# Patient Record
Sex: Female | Born: 1935 | Race: White | Hispanic: No | Marital: Married | Smoking: Never smoker
Health system: Southern US, Community
[De-identification: ages and names within clinical notes are randomized; demographics above are authoritative.]

## PROBLEM LIST (undated history)

## (undated) DIAGNOSIS — I10 Essential (primary) hypertension: Secondary | ICD-10-CM

## (undated) DIAGNOSIS — E7800 Pure hypercholesterolemia, unspecified: Secondary | ICD-10-CM

## (undated) HISTORY — PX: TUBAL LIGATION: SHX77

## (undated) HISTORY — PX: OVARIAN CYST REMOVAL: SHX89

---

## 2002-04-30 ENCOUNTER — Ambulatory Visit (HOSPITAL_COMMUNITY): Admission: RE | Admit: 2002-04-30 | Discharge: 2002-04-30 | Payer: Self-pay | Admitting: Obstetrics & Gynecology

## 2002-04-30 ENCOUNTER — Encounter: Payer: Self-pay | Admitting: Obstetrics & Gynecology

## 2003-11-10 ENCOUNTER — Ambulatory Visit (HOSPITAL_COMMUNITY): Admission: RE | Admit: 2003-11-10 | Discharge: 2003-11-10 | Payer: Self-pay | Admitting: Obstetrics and Gynecology

## 2005-04-27 ENCOUNTER — Ambulatory Visit (HOSPITAL_COMMUNITY): Admission: RE | Admit: 2005-04-27 | Discharge: 2005-04-27 | Payer: Self-pay | Admitting: Obstetrics and Gynecology

## 2011-01-16 ENCOUNTER — Emergency Department (HOSPITAL_COMMUNITY): Payer: Medicare Other

## 2011-01-16 ENCOUNTER — Encounter: Payer: Self-pay | Admitting: *Deleted

## 2011-01-16 ENCOUNTER — Emergency Department (HOSPITAL_COMMUNITY)
Admission: EM | Admit: 2011-01-16 | Discharge: 2011-01-17 | Disposition: A | Discharge status: Home / Self Care | Payer: Medicare Other | Attending: Emergency Medicine | Admitting: Emergency Medicine

## 2011-01-16 DIAGNOSIS — I10 Essential (primary) hypertension: Secondary | ICD-10-CM | POA: Insufficient documentation

## 2011-01-16 DIAGNOSIS — M531 Cervicobrachial syndrome: Secondary | ICD-10-CM | POA: Insufficient documentation

## 2011-01-16 DIAGNOSIS — M5481 Occipital neuralgia: Secondary | ICD-10-CM

## 2011-01-16 DIAGNOSIS — M542 Cervicalgia: Secondary | ICD-10-CM

## 2011-01-16 DIAGNOSIS — E7800 Pure hypercholesterolemia, unspecified: Secondary | ICD-10-CM | POA: Insufficient documentation

## 2011-01-16 HISTORY — DX: Pure hypercholesterolemia, unspecified: E78.00

## 2011-01-16 HISTORY — DX: Essential (primary) hypertension: I10

## 2011-01-16 MED ORDER — HYDROCODONE-ACETAMINOPHEN 5-325 MG PO TABS
2.0000 | ORAL_TABLET | Freq: Once | ORAL | Status: AC
  Administered 2011-01-16: 2 via ORAL
  Filled 2011-01-16: qty 2

## 2011-01-16 MED ORDER — HYDROCODONE-ACETAMINOPHEN 5-500 MG PO TABS: 1.0000 | ORAL_TABLET | Freq: Four times a day (QID) | ORAL | Status: AC | PRN

## 2011-01-16 MED ORDER — PREDNISONE 10 MG PO TABS: 20.0000 mg | ORAL_TABLET | Freq: Every day | ORAL | Status: AC

## 2011-01-16 MED ORDER — KETOROLAC TROMETHAMINE 30 MG/ML IJ SOLN
30.0000 mg | Freq: Once | INTRAMUSCULAR | Status: AC
  Administered 2011-01-16: 30 mg via INTRAMUSCULAR
  Filled 2011-01-16: qty 1

## 2011-01-16 NOTE — ED Notes (Signed)
Pt states that the pain radiates toward her head with certain movements and palpation of neck area

## 2011-01-16 NOTE — ED Provider Notes (Signed)
History     CSN: [id number] Arrival date & time: 01/16/2011  9:30 PM  Chief Complaint  Patient presents with  . Torticollis   HPI Comments: Patient states neck pain since waking up this morning.  She denies injury or trauma.  Pain comes and goes, is what she describes as severe.  No numbness or tingling.    The history is provided by the patient.    Past Medical History  Diagnosis Date  . Hypertension   . Hypercholesteremia     Past Surgical History  Procedure Date  . Tubal ligation   . Ovarian cyst removal     History reviewed. No pertinent family history.  History  Substance Use Topics  . Smoking status: Never Smoker   . Smokeless tobacco: Not on file  . Alcohol Use: No    OB History    Grav Para Term Preterm Abortions TAB SAB Ect Mult Living                  Review of Systems  Constitutional: Negative for fever and chills.  HENT: Positive for neck pain and neck stiffness. Negative for facial swelling.   Eyes: Negative.   Respiratory: Negative for chest tightness and shortness of breath.   Cardiovascular: Negative for chest pain and palpitations.  Neurological: Negative for numbness.  All other systems reviewed and are negative.    Physical Exam  BP 160/88  Pulse 72  Temp(Src) 97.7 F (36.5 C) (Oral)  Resp 16  Ht 5' (1.524 m)  Wt 135 lb (61.236 kg)  BMI 26.37 kg/m2  SpO2 100%  Physical Exam  Constitutional: She is oriented to person, place, and time. She appears well-developed and well-nourished.       uncomfortable  HENT:  Head: Normocephalic and atraumatic.  Eyes: Pupils are equal, round, and reactive to light.  Neck:       There is ttp of the soft tissues of the left side of the neck.  Range of motion is limited due to pain.  Cardiovascular: Normal rate and regular rhythm.   Pulmonary/Chest: Effort normal and breath sounds normal.  Musculoskeletal: Normal range of motion.  Neurological: She is alert and oriented to person, place, and time.  No cranial nerve deficit. She exhibits normal muscle tone. Coordination normal.    ED Course  Procedures  MDM Xrays show degenerative changes in the cervical spine.  Nothing acute.  ? Occipital Neuralgia.  Will treat with steroids, pain meds.  See pcp if not improving in the next week.      Geoffery Lyons, MD 01/16/11 2329

## 2011-01-16 NOTE — ED Notes (Signed)
Pt states that she woke up with pain to neck area, states that pain is increased with movements a certain way, denies any injury

## 2011-01-16 NOTE — ED Notes (Signed)
Patient unable to move head to left side

## 2011-10-09 DIAGNOSIS — IMO0002 Reserved for concepts with insufficient information to code with codable children: Secondary | ICD-10-CM | POA: Diagnosis not present

## 2011-10-09 DIAGNOSIS — I10 Essential (primary) hypertension: Secondary | ICD-10-CM | POA: Diagnosis not present

## 2011-10-09 DIAGNOSIS — E559 Vitamin D deficiency, unspecified: Secondary | ICD-10-CM | POA: Diagnosis not present

## 2011-10-09 DIAGNOSIS — Z79899 Other long term (current) drug therapy: Secondary | ICD-10-CM | POA: Diagnosis not present

## 2011-10-09 DIAGNOSIS — D649 Anemia, unspecified: Secondary | ICD-10-CM | POA: Diagnosis not present

## 2011-10-09 DIAGNOSIS — E785 Hyperlipidemia, unspecified: Secondary | ICD-10-CM | POA: Diagnosis not present

## 2011-10-09 DIAGNOSIS — R5383 Other fatigue: Secondary | ICD-10-CM | POA: Diagnosis not present

## 2011-10-09 DIAGNOSIS — R5381 Other malaise: Secondary | ICD-10-CM | POA: Diagnosis not present

## 2011-10-09 DIAGNOSIS — Z23 Encounter for immunization: Secondary | ICD-10-CM | POA: Diagnosis not present

## 2012-03-07 DIAGNOSIS — Z23 Encounter for immunization: Secondary | ICD-10-CM | POA: Diagnosis not present

## 2013-02-03 DIAGNOSIS — IMO0002 Reserved for concepts with insufficient information to code with codable children: Secondary | ICD-10-CM | POA: Diagnosis not present

## 2013-02-03 DIAGNOSIS — E119 Type 2 diabetes mellitus without complications: Secondary | ICD-10-CM | POA: Diagnosis not present

## 2013-02-20 DIAGNOSIS — R7309 Other abnormal glucose: Secondary | ICD-10-CM | POA: Diagnosis not present

## 2013-02-27 DIAGNOSIS — E785 Hyperlipidemia, unspecified: Secondary | ICD-10-CM | POA: Diagnosis not present

## 2013-02-27 DIAGNOSIS — I10 Essential (primary) hypertension: Secondary | ICD-10-CM | POA: Diagnosis not present

## 2013-02-27 DIAGNOSIS — R7309 Other abnormal glucose: Secondary | ICD-10-CM | POA: Diagnosis not present

## 2013-03-12 DIAGNOSIS — Z23 Encounter for immunization: Secondary | ICD-10-CM | POA: Diagnosis not present

## 2014-03-10 DIAGNOSIS — Z23 Encounter for immunization: Secondary | ICD-10-CM | POA: Diagnosis not present

## 2014-06-10 DIAGNOSIS — E782 Mixed hyperlipidemia: Secondary | ICD-10-CM | POA: Diagnosis not present

## 2014-06-10 DIAGNOSIS — I10 Essential (primary) hypertension: Secondary | ICD-10-CM | POA: Diagnosis not present

## 2014-06-10 DIAGNOSIS — Z6824 Body mass index (BMI) 24.0-24.9, adult: Secondary | ICD-10-CM | POA: Diagnosis not present

## 2015-03-15 DIAGNOSIS — Z23 Encounter for immunization: Secondary | ICD-10-CM | POA: Diagnosis not present

## 2015-03-18 DIAGNOSIS — Z1389 Encounter for screening for other disorder: Secondary | ICD-10-CM | POA: Diagnosis not present

## 2015-03-18 DIAGNOSIS — I10 Essential (primary) hypertension: Secondary | ICD-10-CM | POA: Diagnosis not present

## 2015-03-18 DIAGNOSIS — R42 Dizziness and giddiness: Secondary | ICD-10-CM | POA: Diagnosis not present

## 2015-03-18 DIAGNOSIS — E782 Mixed hyperlipidemia: Secondary | ICD-10-CM | POA: Diagnosis not present

## 2015-03-18 DIAGNOSIS — Z6824 Body mass index (BMI) 24.0-24.9, adult: Secondary | ICD-10-CM | POA: Diagnosis not present

## 2015-04-02 DIAGNOSIS — E782 Mixed hyperlipidemia: Secondary | ICD-10-CM | POA: Diagnosis not present

## 2015-04-02 DIAGNOSIS — Z1389 Encounter for screening for other disorder: Secondary | ICD-10-CM | POA: Diagnosis not present

## 2015-04-02 DIAGNOSIS — I10 Essential (primary) hypertension: Secondary | ICD-10-CM | POA: Diagnosis not present

## 2015-04-02 DIAGNOSIS — R42 Dizziness and giddiness: Secondary | ICD-10-CM | POA: Diagnosis not present

## 2015-04-02 DIAGNOSIS — Z6824 Body mass index (BMI) 24.0-24.9, adult: Secondary | ICD-10-CM | POA: Diagnosis not present

## 2015-04-08 DIAGNOSIS — I10 Essential (primary) hypertension: Secondary | ICD-10-CM | POA: Diagnosis not present

## 2015-04-08 DIAGNOSIS — E782 Mixed hyperlipidemia: Secondary | ICD-10-CM | POA: Diagnosis not present

## 2015-06-14 DIAGNOSIS — J209 Acute bronchitis, unspecified: Secondary | ICD-10-CM | POA: Diagnosis not present

## 2015-06-14 DIAGNOSIS — Z6823 Body mass index (BMI) 23.0-23.9, adult: Secondary | ICD-10-CM | POA: Diagnosis not present

## 2015-06-14 DIAGNOSIS — J069 Acute upper respiratory infection, unspecified: Secondary | ICD-10-CM | POA: Diagnosis not present

## 2015-06-14 DIAGNOSIS — Z1389 Encounter for screening for other disorder: Secondary | ICD-10-CM | POA: Diagnosis not present

## 2015-09-20 DIAGNOSIS — Z1389 Encounter for screening for other disorder: Secondary | ICD-10-CM | POA: Diagnosis not present

## 2015-09-20 DIAGNOSIS — Z6822 Body mass index (BMI) 22.0-22.9, adult: Secondary | ICD-10-CM | POA: Diagnosis not present

## 2015-09-20 DIAGNOSIS — Z0000 Encounter for general adult medical examination without abnormal findings: Secondary | ICD-10-CM | POA: Diagnosis not present

## 2016-03-13 DIAGNOSIS — Z1389 Encounter for screening for other disorder: Secondary | ICD-10-CM | POA: Diagnosis not present

## 2016-03-13 DIAGNOSIS — R7309 Other abnormal glucose: Secondary | ICD-10-CM | POA: Diagnosis not present

## 2016-03-13 DIAGNOSIS — Z6821 Body mass index (BMI) 21.0-21.9, adult: Secondary | ICD-10-CM | POA: Diagnosis not present

## 2016-03-13 DIAGNOSIS — I10 Essential (primary) hypertension: Secondary | ICD-10-CM | POA: Diagnosis not present

## 2016-03-13 DIAGNOSIS — E782 Mixed hyperlipidemia: Secondary | ICD-10-CM | POA: Diagnosis not present

## 2016-03-13 DIAGNOSIS — Z23 Encounter for immunization: Secondary | ICD-10-CM | POA: Diagnosis not present

## 2016-05-01 DIAGNOSIS — Z1389 Encounter for screening for other disorder: Secondary | ICD-10-CM | POA: Diagnosis not present

## 2016-05-01 DIAGNOSIS — Z6821 Body mass index (BMI) 21.0-21.9, adult: Secondary | ICD-10-CM | POA: Diagnosis not present

## 2016-05-01 DIAGNOSIS — R946 Abnormal results of thyroid function studies: Secondary | ICD-10-CM | POA: Diagnosis not present

## 2016-08-24 DIAGNOSIS — Z6822 Body mass index (BMI) 22.0-22.9, adult: Secondary | ICD-10-CM | POA: Diagnosis not present

## 2016-08-24 DIAGNOSIS — Z1389 Encounter for screening for other disorder: Secondary | ICD-10-CM | POA: Diagnosis not present

## 2016-08-24 DIAGNOSIS — R55 Syncope and collapse: Secondary | ICD-10-CM | POA: Diagnosis not present

## 2016-12-07 DIAGNOSIS — Z0000 Encounter for general adult medical examination without abnormal findings: Secondary | ICD-10-CM | POA: Diagnosis not present

## 2016-12-07 DIAGNOSIS — Z6823 Body mass index (BMI) 23.0-23.9, adult: Secondary | ICD-10-CM | POA: Diagnosis not present

## 2017-02-19 DIAGNOSIS — Z23 Encounter for immunization: Secondary | ICD-10-CM | POA: Diagnosis not present

## 2017-12-10 DIAGNOSIS — Z0000 Encounter for general adult medical examination without abnormal findings: Secondary | ICD-10-CM | POA: Diagnosis not present

## 2017-12-10 DIAGNOSIS — I10 Essential (primary) hypertension: Secondary | ICD-10-CM | POA: Diagnosis not present

## 2017-12-10 DIAGNOSIS — E782 Mixed hyperlipidemia: Secondary | ICD-10-CM | POA: Diagnosis not present

## 2017-12-10 DIAGNOSIS — Z0001 Encounter for general adult medical examination with abnormal findings: Secondary | ICD-10-CM | POA: Diagnosis not present

## 2017-12-10 DIAGNOSIS — Z1389 Encounter for screening for other disorder: Secondary | ICD-10-CM | POA: Diagnosis not present

## 2017-12-10 DIAGNOSIS — Z6822 Body mass index (BMI) 22.0-22.9, adult: Secondary | ICD-10-CM | POA: Diagnosis not present

## 2018-02-26 DIAGNOSIS — Z23 Encounter for immunization: Secondary | ICD-10-CM | POA: Diagnosis not present

## 2018-12-26 DIAGNOSIS — Z0000 Encounter for general adult medical examination without abnormal findings: Secondary | ICD-10-CM | POA: Diagnosis not present

## 2018-12-26 DIAGNOSIS — R946 Abnormal results of thyroid function studies: Secondary | ICD-10-CM | POA: Diagnosis not present

## 2018-12-26 DIAGNOSIS — I10 Essential (primary) hypertension: Secondary | ICD-10-CM | POA: Diagnosis not present

## 2018-12-26 DIAGNOSIS — E782 Mixed hyperlipidemia: Secondary | ICD-10-CM | POA: Diagnosis not present

## 2018-12-26 DIAGNOSIS — Z6821 Body mass index (BMI) 21.0-21.9, adult: Secondary | ICD-10-CM | POA: Diagnosis not present

## 2019-04-02 DIAGNOSIS — Z23 Encounter for immunization: Secondary | ICD-10-CM | POA: Diagnosis not present

## 2019-04-08 DIAGNOSIS — H402210 Chronic angle-closure glaucoma, right eye, stage unspecified: Secondary | ICD-10-CM | POA: Diagnosis not present

## 2019-04-08 DIAGNOSIS — H40033 Anatomical narrow angle, bilateral: Secondary | ICD-10-CM | POA: Diagnosis not present

## 2019-04-08 DIAGNOSIS — H2513 Age-related nuclear cataract, bilateral: Secondary | ICD-10-CM | POA: Diagnosis not present

## 2019-08-04 DIAGNOSIS — Z23 Encounter for immunization: Secondary | ICD-10-CM | POA: Diagnosis not present

## 2019-08-13 DIAGNOSIS — H40033 Anatomical narrow angle, bilateral: Secondary | ICD-10-CM | POA: Diagnosis not present

## 2019-08-13 DIAGNOSIS — H2513 Age-related nuclear cataract, bilateral: Secondary | ICD-10-CM | POA: Diagnosis not present

## 2019-08-13 DIAGNOSIS — H402233 Chronic angle-closure glaucoma, bilateral, severe stage: Secondary | ICD-10-CM | POA: Diagnosis not present

## 2019-08-13 DIAGNOSIS — Z9889 Other specified postprocedural states: Secondary | ICD-10-CM | POA: Diagnosis not present

## 2019-09-01 DIAGNOSIS — Z23 Encounter for immunization: Secondary | ICD-10-CM | POA: Diagnosis not present

## 2019-12-29 DIAGNOSIS — Z1389 Encounter for screening for other disorder: Secondary | ICD-10-CM | POA: Diagnosis not present

## 2019-12-29 DIAGNOSIS — Z6821 Body mass index (BMI) 21.0-21.9, adult: Secondary | ICD-10-CM | POA: Diagnosis not present

## 2019-12-29 DIAGNOSIS — E782 Mixed hyperlipidemia: Secondary | ICD-10-CM | POA: Diagnosis not present

## 2019-12-29 DIAGNOSIS — Z0000 Encounter for general adult medical examination without abnormal findings: Secondary | ICD-10-CM | POA: Diagnosis not present

## 2019-12-29 DIAGNOSIS — R7309 Other abnormal glucose: Secondary | ICD-10-CM | POA: Diagnosis not present

## 2019-12-29 DIAGNOSIS — Z0001 Encounter for general adult medical examination with abnormal findings: Secondary | ICD-10-CM | POA: Diagnosis not present

## 2019-12-29 DIAGNOSIS — N342 Other urethritis: Secondary | ICD-10-CM | POA: Diagnosis not present

## 2019-12-29 DIAGNOSIS — I10 Essential (primary) hypertension: Secondary | ICD-10-CM | POA: Diagnosis not present

## 2020-03-01 DIAGNOSIS — Z23 Encounter for immunization: Secondary | ICD-10-CM | POA: Diagnosis not present

## 2020-04-16 DIAGNOSIS — Z23 Encounter for immunization: Secondary | ICD-10-CM | POA: Diagnosis not present

## 2021-01-18 DIAGNOSIS — E782 Mixed hyperlipidemia: Secondary | ICD-10-CM | POA: Diagnosis not present

## 2021-01-18 DIAGNOSIS — Z0000 Encounter for general adult medical examination without abnormal findings: Secondary | ICD-10-CM | POA: Diagnosis not present

## 2021-01-18 DIAGNOSIS — Z1389 Encounter for screening for other disorder: Secondary | ICD-10-CM | POA: Diagnosis not present

## 2021-01-18 DIAGNOSIS — I10 Essential (primary) hypertension: Secondary | ICD-10-CM | POA: Diagnosis not present

## 2021-01-18 DIAGNOSIS — Z6821 Body mass index (BMI) 21.0-21.9, adult: Secondary | ICD-10-CM | POA: Diagnosis not present

## 2021-01-18 DIAGNOSIS — Z1331 Encounter for screening for depression: Secondary | ICD-10-CM | POA: Diagnosis not present

## 2021-03-25 DIAGNOSIS — Z23 Encounter for immunization: Secondary | ICD-10-CM | POA: Diagnosis not present

## 2021-06-16 ENCOUNTER — Emergency Department (HOSPITAL_COMMUNITY)
Admission: EM | Admit: 2021-06-16 | Discharge: 2021-06-16 | Disposition: A | Discharge status: Home / Self Care | Payer: Medicare Other | Attending: Emergency Medicine | Admitting: Emergency Medicine

## 2021-06-16 ENCOUNTER — Emergency Department (HOSPITAL_COMMUNITY): Payer: Medicare Other

## 2021-06-16 ENCOUNTER — Encounter (HOSPITAL_COMMUNITY): Payer: Self-pay | Admitting: Emergency Medicine

## 2021-06-16 ENCOUNTER — Other Ambulatory Visit: Payer: Self-pay

## 2021-06-16 DIAGNOSIS — W19XXXA Unspecified fall, initial encounter: Secondary | ICD-10-CM | POA: Insufficient documentation

## 2021-06-16 DIAGNOSIS — S3992XA Unspecified injury of lower back, initial encounter: Secondary | ICD-10-CM | POA: Diagnosis present

## 2021-06-16 DIAGNOSIS — Z7982 Long term (current) use of aspirin: Secondary | ICD-10-CM | POA: Insufficient documentation

## 2021-06-16 DIAGNOSIS — I10 Essential (primary) hypertension: Secondary | ICD-10-CM | POA: Insufficient documentation

## 2021-06-16 DIAGNOSIS — S32059A Unspecified fracture of fifth lumbar vertebra, initial encounter for closed fracture: Secondary | ICD-10-CM | POA: Diagnosis not present

## 2021-06-16 MED ORDER — HYDROCODONE-ACETAMINOPHEN 5-325 MG PO TABS: 2.0000 | ORAL_TABLET | ORAL | 0 refills | Status: AC | PRN

## 2021-06-16 NOTE — ED Provider Notes (Addendum)
[facility] EMERGENCY DEPARTMENT Provider Note   CSN: [id number] Arrival date & time: 06/16/21  1545     History  Chief Complaint  Patient presents with   Katrina Nichols is a 88 y.o. female.  Past medical history of hypertension, hyperlipidemia.  Patient presents after a fall from December 15.  She was never evaluated following this.  Ever since then she has had pretty significant lower back pain that extends into her bilateral buttocks and down the rear side of her left leg.  She has had difficulty walking and now has to walk with a cane.  She denies any numbness or tingling to her lower extremities.  She denies saddle anesthesia, bowel or bladder dysfunction.   Fall      Home Medications Prior to Admission medications   Medication Sig Start Date End Date Taking? Authorizing Provider  HYDROcodone-acetaminophen (NORCO/VICODIN) 5-325 MG tablet Take 2 tablets by mouth every 4 (four) hours as needed for up to 5 days. 06/16/21 06/21/21 Yes Nevayah Faust, Finis Bud, PA-C  aspirin EC 81 MG tablet Take 81 mg by mouth daily.      [provider]  lovastatin (MEVACOR) 20 MG tablet Take 20 mg by mouth at bedtime.      [provider]  naproxen sodium (ANAPROX) 220 MG tablet Take 220 mg by mouth 3 (three) times daily. ONCE as needed for neck pain. Last dose was around 7:30pm     [provider]  Omega-3 Fatty Acids (FISH OIL) 600 MG CAPS Take 1 capsule by mouth at bedtime.      [provider]  triamterene-hydrochlorothiazide (MAXZIDE) 75-50 MG per tablet Take 1 tablet by mouth daily.      [provider]      Allergies    Patient has no known allergies.    Review of Systems   Review of Systems  Musculoskeletal:  Positive for back pain and gait problem.  All other systems reviewed and are negative.  Physical Exam Updated Vital Signs BP 140/78    Pulse 62    Temp 98.4 F (36.9 C) (Oral)    Resp 18    Ht 5' (1.524 m)    Wt 50.8 kg    SpO2 98%     BMI 21.87 kg/m  Physical Exam Vitals and nursing note reviewed.  Constitutional:      General: She is not in acute distress.    Appearance: Normal appearance. She is well-developed. She is not ill-appearing, toxic-appearing or diaphoretic.  HENT:     Head: Normocephalic and atraumatic.     Nose: No nasal deformity.     Mouth/Throat:     Lips: Pink. No lesions.  Eyes:     General: Gaze aligned appropriately. No scleral icterus.       Right eye: No discharge.        Left eye: No discharge.     Conjunctiva/sclera: Conjunctivae normal.     Right eye: Right conjunctiva is not injected. No exudate or hemorrhage.    Left eye: Left conjunctiva is not injected. No exudate or hemorrhage. Pulmonary:     Effort: Pulmonary effort is normal. No respiratory distress.  Musculoskeletal:     Comments: There was midline L-spine tenderness with no step-offs.  There is sacral TTP with no step-offs.  Reproducible left-sided paraspinal muscular tenderness. DP/PT pulses 2+ and equal bilaterally No leg edema Sensation grossly intact on anterior thighs, dorsum of foot and lateral foot Strength of  knee flexion and extension is 5/5 Plantar and dorsiflexion of ankle 5/5 Gait normal   Skin:    General: Skin is warm and dry.  Neurological:     Mental Status: She is alert and oriented to person, place, and time.  Psychiatric:        Mood and Affect: Mood normal.        Speech: Speech normal.        Behavior: Behavior normal. Behavior is cooperative.    ED Results / Procedures / Treatments   Labs (all labs ordered are listed, but only abnormal results are displayed) Labs Reviewed - No data to display  EKG None  Radiology DG Sacrum/Coccyx  Result Date: 06/16/2021 CLINICAL DATA:  Larey Seat, sacral pain EXAM: SACRUM AND COCCYX - 2+ VIEW COMPARISON:  None. FINDINGS: Frontal and lateral views of the sacrum and coccyx are obtained. The bones are diffusely osteopenic. Age-indeterminate L5 compression  deformity, with approximately 50% loss of height. No evidence of sacral or coccygeal fracture. Sacroiliac joints are unremarkable. IMPRESSION: 1. Age-indeterminate L5 compression deformity. 2. Unremarkable sacrum and coccyx. 3. Osteopenia. Electronically Signed   By: Sharlet Salina M.D.   On: 06/16/2021 21:42    Procedures Procedures   Medications Ordered in ED Medications - No data to display  ED Course/ Medical Decision Making/ A&P                           Medical Decision Making Problems Addressed: Closed fracture of fifth lumbar vertebra, unspecified fracture morphology, initial encounter [facility]): acute illness or injury  Amount and/or Complexity of Data Reviewed Radiology: ordered and independent interpretation performed. Decision-making details documented in ED Course. Discussion of management or test interpretation with external provider(s): Neurosurgery   Risk Parenteral controlled substances. Decision regarding hospitalization.   This is a 88 y.o. female with a PMH of hypertension, hyperlipidemia who presents to the ED with lower back pain after a fall three weeks ago. No red flag symptoms present. Patient with normal gait with use of cane.   Will obtain CT imaging of lumbar spine to evaluate for fracture.   CT is notable for L5 fracture with superimposed disc bulge and facet hypertrophy at L4-L5 with resulting severe spinal stenosis.   There was no neurosurgery provider on call for [facility] tonight. This is a three week old injury and with no neurological symptoms. She should be able to follow up in the neurosurgery office. Return precautions provided.   I have seen and evaluated this patient in conjunction with my attending physician who agrees and has made changes to the plan accordingly.  Portions of this note were generated with Scientist, clinical (histocompatibility and immunogenetics). Dictation errors may occur despite best attempts at proofreading.  Final Clinical Impression(s) / ED  Diagnoses Final diagnoses:  Fall, initial encounter  Closed fracture of fifth lumbar vertebra, unspecified fracture morphology, initial encounter [facility])    Rx / DC Orders ED Discharge Orders          Ordered    HYDROcodone-acetaminophen (NORCO/VICODIN) 5-325 MG tablet  Every 4 hours PRN        06/16/21 2333              Claudie Leach, PA-C 06/16/21 2337    Claudie Leach, PA-C 06/16/21 2346    Bethann Berkshire, MD 06/17/21 1052

## 2021-06-16 NOTE — ED Triage Notes (Signed)
Pt c/o she tripped and fell 12/15. C/o pain to lower back, bilateral buttocks and legs. Has been using a cane since fall. Nad noted.

## 2021-06-16 NOTE — Discharge Instructions (Addendum)
You have been diagnosed with a fracture of your L5 vertebrae. You will need to follow up with Neurosurgery. The contact information is included in your discharge paperwork.  I have provided you a prescription for pain. Please do not take this prior to driving or operating heavy machinery.

## 2021-07-14 ENCOUNTER — Encounter (HOSPITAL_COMMUNITY): Payer: Self-pay | Admitting: Emergency Medicine

## 2021-07-14 ENCOUNTER — Other Ambulatory Visit: Payer: Self-pay

## 2021-07-14 ENCOUNTER — Emergency Department (HOSPITAL_COMMUNITY): Payer: Medicare Other

## 2021-07-14 ENCOUNTER — Emergency Department (HOSPITAL_COMMUNITY)
Admission: EM | Admit: 2021-07-14 | Discharge: 2021-07-14 | Disposition: A | Discharge status: Home / Self Care | Payer: Medicare Other | Source: Home / Self Care | Attending: Emergency Medicine | Admitting: Emergency Medicine

## 2021-07-14 DIAGNOSIS — R918 Other nonspecific abnormal finding of lung field: Secondary | ICD-10-CM | POA: Diagnosis not present

## 2021-07-14 DIAGNOSIS — S32059D Unspecified fracture of fifth lumbar vertebra, subsequent encounter for fracture with routine healing: Secondary | ICD-10-CM | POA: Diagnosis not present

## 2021-07-14 DIAGNOSIS — S0083XA Contusion of other part of head, initial encounter: Secondary | ICD-10-CM | POA: Diagnosis not present

## 2021-07-14 DIAGNOSIS — R270 Ataxia, unspecified: Secondary | ICD-10-CM | POA: Diagnosis not present

## 2021-07-14 DIAGNOSIS — K829 Disease of gallbladder, unspecified: Secondary | ICD-10-CM | POA: Diagnosis not present

## 2021-07-14 DIAGNOSIS — W19XXXA Unspecified fall, initial encounter: Secondary | ICD-10-CM | POA: Diagnosis not present

## 2021-07-14 DIAGNOSIS — M545 Low back pain, unspecified: Secondary | ICD-10-CM

## 2021-07-14 DIAGNOSIS — S32402A Unspecified fracture of left acetabulum, initial encounter for closed fracture: Secondary | ICD-10-CM | POA: Diagnosis not present

## 2021-07-14 DIAGNOSIS — S0990XA Unspecified injury of head, initial encounter: Secondary | ICD-10-CM | POA: Diagnosis not present

## 2021-07-14 DIAGNOSIS — Z79899 Other long term (current) drug therapy: Secondary | ICD-10-CM | POA: Diagnosis not present

## 2021-07-14 DIAGNOSIS — Z20822 Contact with and (suspected) exposure to covid-19: Secondary | ICD-10-CM | POA: Diagnosis present

## 2021-07-14 DIAGNOSIS — N179 Acute kidney failure, unspecified: Secondary | ICD-10-CM | POA: Diagnosis not present

## 2021-07-14 DIAGNOSIS — I959 Hypotension, unspecified: Secondary | ICD-10-CM | POA: Diagnosis not present

## 2021-07-14 DIAGNOSIS — R079 Chest pain, unspecified: Secondary | ICD-10-CM | POA: Diagnosis not present

## 2021-07-14 DIAGNOSIS — M25552 Pain in left hip: Secondary | ICD-10-CM

## 2021-07-14 DIAGNOSIS — S32810A Multiple fractures of pelvis with stable disruption of pelvic ring, initial encounter for closed fracture: Secondary | ICD-10-CM | POA: Diagnosis present

## 2021-07-14 DIAGNOSIS — Z7982 Long term (current) use of aspirin: Secondary | ICD-10-CM | POA: Insufficient documentation

## 2021-07-14 DIAGNOSIS — S0181XA Laceration without foreign body of other part of head, initial encounter: Secondary | ICD-10-CM | POA: Insufficient documentation

## 2021-07-14 DIAGNOSIS — Z7401 Bed confinement status: Secondary | ICD-10-CM | POA: Diagnosis not present

## 2021-07-14 DIAGNOSIS — S32059A Unspecified fracture of fifth lumbar vertebra, initial encounter for closed fracture: Secondary | ICD-10-CM | POA: Diagnosis not present

## 2021-07-14 DIAGNOSIS — I739 Peripheral vascular disease, unspecified: Secondary | ICD-10-CM | POA: Diagnosis not present

## 2021-07-14 DIAGNOSIS — J9601 Acute respiratory failure with hypoxia: Secondary | ICD-10-CM | POA: Diagnosis not present

## 2021-07-14 DIAGNOSIS — R7989 Other specified abnormal findings of blood chemistry: Secondary | ICD-10-CM | POA: Diagnosis present

## 2021-07-14 DIAGNOSIS — R0902 Hypoxemia: Secondary | ICD-10-CM | POA: Diagnosis not present

## 2021-07-14 DIAGNOSIS — S060X0A Concussion without loss of consciousness, initial encounter: Secondary | ICD-10-CM | POA: Diagnosis present

## 2021-07-14 DIAGNOSIS — S299XXA Unspecified injury of thorax, initial encounter: Secondary | ICD-10-CM | POA: Diagnosis not present

## 2021-07-14 DIAGNOSIS — S32512A Fracture of superior rim of left pubis, initial encounter for closed fracture: Secondary | ICD-10-CM | POA: Diagnosis not present

## 2021-07-14 DIAGNOSIS — S32050D Wedge compression fracture of fifth lumbar vertebra, subsequent encounter for fracture with routine healing: Secondary | ICD-10-CM

## 2021-07-14 DIAGNOSIS — Y92410 Unspecified street and highway as the place of occurrence of the external cause: Secondary | ICD-10-CM | POA: Diagnosis not present

## 2021-07-14 DIAGNOSIS — S0993XA Unspecified injury of face, initial encounter: Secondary | ICD-10-CM | POA: Diagnosis not present

## 2021-07-14 DIAGNOSIS — M4312 Spondylolisthesis, cervical region: Secondary | ICD-10-CM | POA: Diagnosis not present

## 2021-07-14 DIAGNOSIS — E860 Dehydration: Secondary | ICD-10-CM | POA: Diagnosis present

## 2021-07-14 DIAGNOSIS — E7800 Pure hypercholesterolemia, unspecified: Secondary | ICD-10-CM | POA: Diagnosis present

## 2021-07-14 DIAGNOSIS — M4856XD Collapsed vertebra, not elsewhere classified, lumbar region, subsequent encounter for fracture with routine healing: Secondary | ICD-10-CM | POA: Diagnosis present

## 2021-07-14 DIAGNOSIS — R52 Pain, unspecified: Secondary | ICD-10-CM | POA: Diagnosis not present

## 2021-07-14 DIAGNOSIS — Z041 Encounter for examination and observation following transport accident: Secondary | ICD-10-CM | POA: Diagnosis not present

## 2021-07-14 DIAGNOSIS — R569 Unspecified convulsions: Secondary | ICD-10-CM | POA: Diagnosis not present

## 2021-07-14 DIAGNOSIS — S3210XA Unspecified fracture of sacrum, initial encounter for closed fracture: Secondary | ICD-10-CM | POA: Diagnosis not present

## 2021-07-14 DIAGNOSIS — I10 Essential (primary) hypertension: Secondary | ICD-10-CM | POA: Diagnosis not present

## 2021-07-14 DIAGNOSIS — W19XXXD Unspecified fall, subsequent encounter: Secondary | ICD-10-CM | POA: Diagnosis present

## 2021-07-14 DIAGNOSIS — S32502A Unspecified fracture of left pubis, initial encounter for closed fracture: Secondary | ICD-10-CM | POA: Diagnosis not present

## 2021-07-14 DIAGNOSIS — R911 Solitary pulmonary nodule: Secondary | ICD-10-CM | POA: Diagnosis not present

## 2021-07-14 DIAGNOSIS — R102 Pelvic and perineal pain: Secondary | ICD-10-CM | POA: Diagnosis not present

## 2021-07-14 DIAGNOSIS — S32110A Nondisplaced Zone I fracture of sacrum, initial encounter for closed fracture: Secondary | ICD-10-CM | POA: Diagnosis present

## 2021-07-14 DIAGNOSIS — R54 Age-related physical debility: Secondary | ICD-10-CM | POA: Diagnosis present

## 2021-07-14 LAB — CBC WITH DIFFERENTIAL/PLATELET
Abs Immature Granulocytes: 0.11 10*3/uL — ABNORMAL HIGH (ref 0.00–0.07)
Basophils Absolute: 0.1 10*3/uL (ref 0.0–0.1)
Basophils Relative: 1 %
Eosinophils Absolute: 0.1 10*3/uL (ref 0.0–0.5)
Eosinophils Relative: 1 %
HCT: 43.5 % (ref 36.0–46.0)
Hemoglobin: 14.2 g/dL (ref 12.0–15.0)
Immature Granulocytes: 1 %
Lymphocytes Relative: 13 %
Lymphs Abs: 1.2 10*3/uL (ref 0.7–4.0)
MCH: 31.8 pg (ref 26.0–34.0)
MCHC: 32.6 g/dL (ref 30.0–36.0)
MCV: 97.3 fL (ref 80.0–100.0)
Monocytes Absolute: 0.7 10*3/uL (ref 0.1–1.0)
Monocytes Relative: 7 %
Neutro Abs: 7.1 10*3/uL (ref 1.7–7.7)
Neutrophils Relative %: 77 %
Platelets: 275 10*3/uL (ref 150–400)
RBC: 4.47 MIL/uL (ref 3.87–5.11)
RDW: 13.9 % (ref 11.5–15.5)
WBC: 9.3 10*3/uL (ref 4.0–10.5)
nRBC: 0 % (ref 0.0–0.2)

## 2021-07-14 LAB — BASIC METABOLIC PANEL
Anion gap: 7 (ref 5–15)
BUN: 26 mg/dL — ABNORMAL HIGH (ref 8–23)
CO2: 27 mmol/L (ref 22–32)
Calcium: 10 mg/dL (ref 8.9–10.3)
Chloride: 102 mmol/L (ref 98–111)
Creatinine, Ser: 0.92 mg/dL (ref 0.44–1.00)
GFR, Estimated: >60 mL/min (ref >60)
Glucose, Bld: 108 mg/dL — ABNORMAL HIGH (ref 70–99)
Potassium: 4.3 mmol/L (ref 3.5–5.1)
Sodium: 136 mmol/L (ref 135–145)

## 2021-07-14 MED ORDER — IOHEXOL 300 MG/ML  SOLN
100.0000 mL | Freq: Once | INTRAMUSCULAR | Status: AC | PRN
  Administered 2021-07-14: 100 mL via INTRAVENOUS
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
## 2021-07-14 NOTE — ED Notes (Signed)
Patient transported to CT 

## 2021-07-14 NOTE — Discharge Instructions (Signed)
Keep your appointment to follow-up with neurosurgery.  Expect to be very sore and stiff over the next few days.  Take Tylenol as needed.  No new injuries of significance the L5 lumbar compression fracture is unchanged.

## 2021-07-15 ENCOUNTER — Emergency Department (HOSPITAL_COMMUNITY): Payer: Medicare Other

## 2021-07-15 ENCOUNTER — Inpatient Hospital Stay (HOSPITAL_COMMUNITY)
Admission: EM | Admit: 2021-07-15 | Discharge: 2021-07-19 | DRG: 100 | Disposition: A | Discharge status: Home Health | Payer: Medicare Other | Attending: Internal Medicine | Admitting: Internal Medicine

## 2021-07-15 ENCOUNTER — Other Ambulatory Visit: Payer: Self-pay

## 2021-07-15 ENCOUNTER — Encounter (HOSPITAL_COMMUNITY): Payer: Self-pay

## 2021-07-15 ENCOUNTER — Inpatient Hospital Stay (HOSPITAL_COMMUNITY): Payer: Medicare Other

## 2021-07-15 DIAGNOSIS — M4312 Spondylolisthesis, cervical region: Secondary | ICD-10-CM | POA: Diagnosis not present

## 2021-07-15 DIAGNOSIS — M25552 Pain in left hip: Secondary | ICD-10-CM

## 2021-07-15 DIAGNOSIS — I739 Peripheral vascular disease, unspecified: Secondary | ICD-10-CM | POA: Diagnosis not present

## 2021-07-15 DIAGNOSIS — N179 Acute kidney failure, unspecified: Secondary | ICD-10-CM | POA: Diagnosis present

## 2021-07-15 DIAGNOSIS — S32810A Multiple fractures of pelvis with stable disruption of pelvic ring, initial encounter for closed fracture: Secondary | ICD-10-CM | POA: Diagnosis present

## 2021-07-15 DIAGNOSIS — R270 Ataxia, unspecified: Secondary | ICD-10-CM | POA: Diagnosis not present

## 2021-07-15 DIAGNOSIS — R0603 Acute respiratory distress: Secondary | ICD-10-CM

## 2021-07-15 DIAGNOSIS — R7989 Other specified abnormal findings of blood chemistry: Secondary | ICD-10-CM | POA: Diagnosis present

## 2021-07-15 DIAGNOSIS — J9601 Acute respiratory failure with hypoxia: Secondary | ICD-10-CM | POA: Diagnosis present

## 2021-07-15 DIAGNOSIS — R569 Unspecified convulsions: Secondary | ICD-10-CM | POA: Diagnosis present

## 2021-07-15 DIAGNOSIS — R0902 Hypoxemia: Secondary | ICD-10-CM | POA: Diagnosis not present

## 2021-07-15 DIAGNOSIS — E860 Dehydration: Secondary | ICD-10-CM | POA: Diagnosis present

## 2021-07-15 DIAGNOSIS — W19XXXD Unspecified fall, subsequent encounter: Secondary | ICD-10-CM | POA: Diagnosis present

## 2021-07-15 DIAGNOSIS — I959 Hypotension, unspecified: Secondary | ICD-10-CM | POA: Diagnosis not present

## 2021-07-15 DIAGNOSIS — Y92410 Unspecified street and highway as the place of occurrence of the external cause: Secondary | ICD-10-CM

## 2021-07-15 DIAGNOSIS — Z20822 Contact with and (suspected) exposure to covid-19: Secondary | ICD-10-CM | POA: Diagnosis present

## 2021-07-15 DIAGNOSIS — S0083XA Contusion of other part of head, initial encounter: Secondary | ICD-10-CM | POA: Diagnosis present

## 2021-07-15 DIAGNOSIS — Z79899 Other long term (current) drug therapy: Secondary | ICD-10-CM

## 2021-07-15 DIAGNOSIS — S32110A Nondisplaced Zone I fracture of sacrum, initial encounter for closed fracture: Secondary | ICD-10-CM | POA: Diagnosis present

## 2021-07-15 DIAGNOSIS — S060X0A Concussion without loss of consciousness, initial encounter: Secondary | ICD-10-CM | POA: Diagnosis present

## 2021-07-15 DIAGNOSIS — Z041 Encounter for examination and observation following transport accident: Secondary | ICD-10-CM | POA: Diagnosis not present

## 2021-07-15 DIAGNOSIS — E7800 Pure hypercholesterolemia, unspecified: Secondary | ICD-10-CM | POA: Diagnosis present

## 2021-07-15 DIAGNOSIS — S0990XA Unspecified injury of head, initial encounter: Secondary | ICD-10-CM | POA: Diagnosis not present

## 2021-07-15 DIAGNOSIS — R911 Solitary pulmonary nodule: Secondary | ICD-10-CM | POA: Diagnosis present

## 2021-07-15 DIAGNOSIS — Z7401 Bed confinement status: Secondary | ICD-10-CM | POA: Diagnosis not present

## 2021-07-15 DIAGNOSIS — S3210XA Unspecified fracture of sacrum, initial encounter for closed fracture: Secondary | ICD-10-CM | POA: Diagnosis not present

## 2021-07-15 DIAGNOSIS — S329XXA Fracture of unspecified parts of lumbosacral spine and pelvis, initial encounter for closed fracture: Secondary | ICD-10-CM | POA: Diagnosis present

## 2021-07-15 DIAGNOSIS — S32512A Fracture of superior rim of left pubis, initial encounter for closed fracture: Secondary | ICD-10-CM | POA: Diagnosis not present

## 2021-07-15 DIAGNOSIS — M4856XD Collapsed vertebra, not elsewhere classified, lumbar region, subsequent encounter for fracture with routine healing: Secondary | ICD-10-CM | POA: Diagnosis present

## 2021-07-15 DIAGNOSIS — I10 Essential (primary) hypertension: Secondary | ICD-10-CM | POA: Diagnosis present

## 2021-07-15 DIAGNOSIS — R54 Age-related physical debility: Secondary | ICD-10-CM | POA: Diagnosis present

## 2021-07-15 DIAGNOSIS — S32059A Unspecified fracture of fifth lumbar vertebra, initial encounter for closed fracture: Secondary | ICD-10-CM | POA: Diagnosis not present

## 2021-07-15 DIAGNOSIS — S32402A Unspecified fracture of left acetabulum, initial encounter for closed fracture: Secondary | ICD-10-CM | POA: Diagnosis not present

## 2021-07-15 DIAGNOSIS — R918 Other nonspecific abnormal finding of lung field: Secondary | ICD-10-CM

## 2021-07-15 DIAGNOSIS — S32502A Unspecified fracture of left pubis, initial encounter for closed fracture: Secondary | ICD-10-CM | POA: Diagnosis not present

## 2021-07-15 DIAGNOSIS — S72009A Fracture of unspecified part of neck of unspecified femur, initial encounter for closed fracture: Secondary | ICD-10-CM | POA: Diagnosis present

## 2021-07-15 LAB — CBC WITH DIFFERENTIAL/PLATELET
Abs Immature Granulocytes: 0.04 10*3/uL (ref 0.00–0.07)
Basophils Absolute: 0.1 10*3/uL (ref 0.0–0.1)
Basophils Relative: 1 %
Eosinophils Absolute: 0.3 10*3/uL (ref 0.0–0.5)
Eosinophils Relative: 3 %
HCT: 40.3 % (ref 36.0–46.0)
Hemoglobin: 13.2 g/dL (ref 12.0–15.0)
Immature Granulocytes: 0 %
Lymphocytes Relative: 14 %
Lymphs Abs: 1.5 10*3/uL (ref 0.7–4.0)
MCH: 31.3 pg (ref 26.0–34.0)
MCHC: 32.8 g/dL (ref 30.0–36.0)
MCV: 95.5 fL (ref 80.0–100.0)
Monocytes Absolute: 1.1 10*3/uL — ABNORMAL HIGH (ref 0.1–1.0)
Monocytes Relative: 10 %
Neutro Abs: 7.6 10*3/uL (ref 1.7–7.7)
Neutrophils Relative %: 72 %
Platelets: 235 10*3/uL (ref 150–400)
RBC: 4.22 MIL/uL (ref 3.87–5.11)
RDW: 14.2 % (ref 11.5–15.5)
WBC: 10.6 10*3/uL — ABNORMAL HIGH (ref 4.0–10.5)
nRBC: 0 % (ref 0.0–0.2)

## 2021-07-15 LAB — COMPREHENSIVE METABOLIC PANEL
ALT: 22 U/L (ref 0–44)
AST: 25 U/L (ref 15–41)
Albumin: 3.8 g/dL (ref 3.5–5.0)
Alkaline Phosphatase: 75 U/L (ref 38–126)
Anion gap: 11 (ref 5–15)
BUN: 46 mg/dL — ABNORMAL HIGH (ref 8–23)
CO2: 26 mmol/L (ref 22–32)
Calcium: 9.4 mg/dL (ref 8.9–10.3)
Chloride: 102 mmol/L (ref 98–111)
Creatinine, Ser: 1.95 mg/dL — ABNORMAL HIGH (ref 0.44–1.00)
GFR, Estimated: 25 mL/min — ABNORMAL LOW (ref >60)
Glucose, Bld: 149 mg/dL — ABNORMAL HIGH (ref 70–99)
Potassium: 3.7 mmol/L (ref 3.5–5.1)
Sodium: 139 mmol/L (ref 135–145)
Total Bilirubin: 0.7 mg/dL (ref 0.3–1.2)
Total Protein: 7.3 g/dL (ref 6.5–8.1)

## 2021-07-15 LAB — MAGNESIUM: Magnesium: 1.9 mg/dL (ref 1.7–2.4)

## 2021-07-15 LAB — RESP PANEL BY RT-PCR (FLU A&B, COVID) ARPGX2
Influenza A by PCR: NEGATIVE
Influenza B by PCR: NEGATIVE
SARS Coronavirus 2 by RT PCR: NEGATIVE

## 2021-07-15 LAB — CK: Total CK: 57 U/L (ref 38–234)

## 2021-07-15 MED ORDER — LACTATED RINGERS IV BOLUS
500.0000 mL | Freq: Once | INTRAVENOUS | Status: AC
  Administered 2021-07-15: 500 mL via INTRAVENOUS

## 2021-07-15 MED ORDER — LACTATED RINGERS IV SOLN: INTRAVENOUS | Status: DC

## 2021-07-15 MED ORDER — OXYCODONE HCL 5 MG PO TABS
5.0000 mg | ORAL_TABLET | ORAL | Status: DC | PRN
  Administered 2021-07-16 – 2021-07-19 (×5): 5 mg via ORAL
  Filled 2021-07-15 (×6): qty 1

## 2021-07-15 MED ORDER — HEPARIN SODIUM (PORCINE) 5000 UNIT/ML IJ SOLN
5000.0000 [IU] | Freq: Three times a day (TID) | INTRAMUSCULAR | Status: DC
  Administered 2021-07-16 – 2021-07-19 (×8): 5000 [IU] via SUBCUTANEOUS
  Filled 2021-07-15 (×9): qty 1

## 2021-07-15 MED ORDER — ONDANSETRON HCL 4 MG/2ML IJ SOLN
4.0000 mg | Freq: Once | INTRAMUSCULAR | Status: AC
  Administered 2021-07-15: 4 mg via INTRAVENOUS
  Filled 2021-07-15: qty 2

## 2021-07-15 MED ORDER — METHOCARBAMOL 1000 MG/10ML IJ SOLN: 500.0000 mg | Freq: Four times a day (QID) | INTRAVENOUS | Status: DC | PRN

## 2021-07-15 MED ORDER — FENTANYL CITRATE PF 50 MCG/ML IJ SOSY
25.0000 ug | PREFILLED_SYRINGE | Freq: Once | INTRAMUSCULAR | Status: AC
  Administered 2021-07-15: 25 ug via INTRAVENOUS
  Filled 2021-07-15: qty 1

## 2021-07-15 MED ORDER — MORPHINE SULFATE (PF) 2 MG/ML IV SOLN
1.0000 mg | INTRAVENOUS | Status: DC | PRN
  Administered 2021-07-15 – 2021-07-18 (×5): 1 mg via INTRAVENOUS
  Filled 2021-07-15 (×5): qty 1

## 2021-07-15 MED ORDER — ACETAMINOPHEN 650 MG RE SUPP: 650.0000 mg | Freq: Four times a day (QID) | RECTAL | Status: DC | PRN

## 2021-07-15 MED ORDER — ACETAMINOPHEN 325 MG PO TABS
650.0000 mg | ORAL_TABLET | Freq: Four times a day (QID) | ORAL | Status: DC | PRN
  Administered 2021-07-18: 650 mg via ORAL
  Filled 2021-07-15 (×2): qty 2

## 2021-07-15 MED ORDER — HYDRALAZINE HCL 20 MG/ML IJ SOLN: 5.0000 mg | INTRAMUSCULAR | Status: DC | PRN

## 2021-07-15 NOTE — ED Notes (Signed)
Pt oxygen saturation in the upper 80%, patient does not usually wear oxygen. Denies shortness or breath or dizziness. Pt placed on 2 L [place], EDP Mahira Petras notified. Pt oxygen saturation now 96-100%
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
## 2021-07-15 NOTE — ED Notes (Signed)
Bladder scan by Tech, 54 cc in bladder

## 2021-07-15 NOTE — ED Notes (Signed)
42mL found in patients bladder
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
## 2021-07-16 ENCOUNTER — Inpatient Hospital Stay (HOSPITAL_COMMUNITY): Payer: Medicare Other

## 2021-07-16 DIAGNOSIS — R918 Other nonspecific abnormal finding of lung field: Secondary | ICD-10-CM | POA: Diagnosis not present

## 2021-07-16 DIAGNOSIS — S329XXA Fracture of unspecified parts of lumbosacral spine and pelvis, initial encounter for closed fracture: Secondary | ICD-10-CM | POA: Diagnosis present

## 2021-07-16 DIAGNOSIS — N179 Acute kidney failure, unspecified: Secondary | ICD-10-CM | POA: Diagnosis not present

## 2021-07-16 DIAGNOSIS — S32059A Unspecified fracture of fifth lumbar vertebra, initial encounter for closed fracture: Secondary | ICD-10-CM | POA: Diagnosis present

## 2021-07-16 DIAGNOSIS — S72009A Fracture of unspecified part of neck of unspecified femur, initial encounter for closed fracture: Secondary | ICD-10-CM | POA: Diagnosis present

## 2021-07-16 DIAGNOSIS — J9601 Acute respiratory failure with hypoxia: Secondary | ICD-10-CM | POA: Diagnosis not present

## 2021-07-16 DIAGNOSIS — R0603 Acute respiratory distress: Secondary | ICD-10-CM

## 2021-07-16 LAB — CBC
HCT: 32 % — ABNORMAL LOW (ref 36.0–46.0)
Hemoglobin: 10.4 g/dL — ABNORMAL LOW (ref 12.0–15.0)
MCH: 31.8 pg (ref 26.0–34.0)
MCHC: 32.5 g/dL (ref 30.0–36.0)
MCV: 97.9 fL (ref 80.0–100.0)
Platelets: 174 10*3/uL (ref 150–400)
RBC: 3.27 MIL/uL — ABNORMAL LOW (ref 3.87–5.11)
RDW: 14.3 % (ref 11.5–15.5)
WBC: 8.1 10*3/uL (ref 4.0–10.5)
nRBC: 0 % (ref 0.0–0.2)

## 2021-07-16 LAB — BASIC METABOLIC PANEL
Anion gap: 7 (ref 5–15)
BUN: 40 mg/dL — ABNORMAL HIGH (ref 8–23)
CO2: 28 mmol/L (ref 22–32)
Calcium: 9.1 mg/dL (ref 8.9–10.3)
Chloride: 105 mmol/L (ref 98–111)
Creatinine, Ser: 1 mg/dL (ref 0.44–1.00)
GFR, Estimated: 55 mL/min — ABNORMAL LOW (ref >60)
Glucose, Bld: 101 mg/dL — ABNORMAL HIGH (ref 70–99)
Potassium: 3.6 mmol/L (ref 3.5–5.1)
Sodium: 140 mmol/L (ref 135–145)

## 2021-07-16 NOTE — Progress Notes (Signed)
Report called to [facility]. Report called to [facility]. [facility] just arrived to bedside to transport patient. Morphine 1 mg given for pain. Pts daughter at bedside and will follow behind to [facility].

## 2021-07-16 NOTE — Assessment & Plan Note (Signed)
°-  CT of pelvic hip IMPRESSION: 1. Nondisplaced fracture of the left superior pubic ramus-acetabular junction. 2. Minimally displaced fracture of the left inferior pubic ramus. 3. Nondisplaced fracture of the left sacral ala. 4. L5 vertebral body compression fracture with approximately 50% height loss better characterized on the CT of the lumbar spine performed same day.  -Continue as needed p.o. and IV analgesics -Pending PT OT evaluation Orthopedic team Dr. Aundria Rud was consulted, agreed to follow and evaluate the patient upon arrival at [facility]

## 2021-07-16 NOTE — Assessment & Plan Note (Signed)
-  According to daughter patient had a previous fall sustaining vertebral fracture Was supposed to be eval by orthopedic for intervention  -Continue as needed p.o. and IV analgesics -Pending PT OT evaluation Orthopedic team Dr. Aundria Rud was consulted, appreciate input -

## 2021-07-16 NOTE — Assessment & Plan Note (Signed)
-   Acute hypoxia, mild respite distress -On arrival patient was reported was satting in 80s on room air, on 2 L improved to greater than 97% -This morning on 1 L of oxygen satting 92% -No signs of labored breathing wheezing or rhonchi -The patient continues to be symptomatic we will consider VQ scan versus CTA -No indication at this time, patient is not tachypneic tachycardic blood pressure stable no leukocytosis denies any chest pain -DuoNeb bronchodilators as needed, supplemental oxygen
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
## 2021-07-16 NOTE — Assessment & Plan Note (Signed)
-  Continue as needed p.o. and IV analgesics -Pending PT OT evaluation

## 2021-07-16 NOTE — Assessment & Plan Note (Signed)
-  New onset seizure activity witnessed by daughter - Admitted to telemetry floor -Seizure precautions -CT of the head negative -Neurologist Dr. Selina Cooley and Dr. Thomasena Edis at [facility] was consulted recommended patient to be transferred for MRI of the brain and EEG
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
## 2021-07-16 NOTE — Assessment & Plan Note (Signed)
-   Follow-up as an outpatient -Recommending CT of the chest within 6 months
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
## 2021-07-17 ENCOUNTER — Inpatient Hospital Stay (HOSPITAL_COMMUNITY): Payer: Medicare Other

## 2021-07-17 DIAGNOSIS — R918 Other nonspecific abnormal finding of lung field: Secondary | ICD-10-CM | POA: Diagnosis not present

## 2021-07-17 DIAGNOSIS — S32512A Fracture of superior rim of left pubis, initial encounter for closed fracture: Secondary | ICD-10-CM | POA: Diagnosis not present

## 2021-07-17 DIAGNOSIS — J9601 Acute respiratory failure with hypoxia: Secondary | ICD-10-CM | POA: Diagnosis not present

## 2021-07-17 DIAGNOSIS — N179 Acute kidney failure, unspecified: Secondary | ICD-10-CM | POA: Diagnosis not present

## 2021-07-17 DIAGNOSIS — S32502A Unspecified fracture of left pubis, initial encounter for closed fracture: Secondary | ICD-10-CM | POA: Diagnosis not present

## 2021-07-17 DIAGNOSIS — S3210XA Unspecified fracture of sacrum, initial encounter for closed fracture: Secondary | ICD-10-CM | POA: Diagnosis not present

## 2021-07-17 DIAGNOSIS — R569 Unspecified convulsions: Secondary | ICD-10-CM | POA: Diagnosis not present

## 2021-07-17 DIAGNOSIS — S32059A Unspecified fracture of fifth lumbar vertebra, initial encounter for closed fracture: Secondary | ICD-10-CM | POA: Diagnosis not present

## 2021-07-17 LAB — BASIC METABOLIC PANEL
Anion gap: 9 (ref 5–15)
BUN: 18 mg/dL (ref 8–23)
CO2: 25 mmol/L (ref 22–32)
Calcium: 8.8 mg/dL — ABNORMAL LOW (ref 8.9–10.3)
Chloride: 102 mmol/L (ref 98–111)
Creatinine, Ser: 0.81 mg/dL (ref 0.44–1.00)
GFR, Estimated: >60 mL/min (ref >60)
Glucose, Bld: 102 mg/dL — ABNORMAL HIGH (ref 70–99)
Potassium: 3.6 mmol/L (ref 3.5–5.1)
Sodium: 136 mmol/L (ref 135–145)

## 2021-07-17 LAB — TYPE AND SCREEN
ABO/RH(D): O POS
Antibody Screen: NEGATIVE

## 2021-07-17 LAB — CBC
HCT: 30.6 % — ABNORMAL LOW (ref 36.0–46.0)
Hemoglobin: 10.2 g/dL — ABNORMAL LOW (ref 12.0–15.0)
MCH: 31.9 pg (ref 26.0–34.0)
MCHC: 33.3 g/dL (ref 30.0–36.0)
MCV: 95.6 fL (ref 80.0–100.0)
Platelets: 161 10*3/uL (ref 150–400)
RBC: 3.2 MIL/uL — ABNORMAL LOW (ref 3.87–5.11)
RDW: 13.9 % (ref 11.5–15.5)
WBC: 8.8 10*3/uL (ref 4.0–10.5)
nRBC: 0 % (ref 0.0–0.2)

## 2021-07-17 LAB — ABO/RH: ABO/RH(D): O POS

## 2021-07-17 LAB — MAGNESIUM: Magnesium: 1.3 mg/dL — ABNORMAL LOW (ref 1.7–2.4)

## 2021-07-17 MED ORDER — PRAVASTATIN SODIUM 10 MG PO TABS
20.0000 mg | ORAL_TABLET | Freq: Every day | ORAL | Status: DC
  Administered 2021-07-17 – 2021-07-18 (×2): 20 mg via ORAL
  Filled 2021-07-17 (×2): qty 2

## 2021-07-17 MED ORDER — ADULT MULTIVITAMIN W/MINERALS CH
1.0000 | ORAL_TABLET | Freq: Every day | ORAL | Status: DC
  Administered 2021-07-17 – 2021-07-19 (×3): 1 via ORAL
  Filled 2021-07-17 (×3): qty 1

## 2021-07-17 MED ORDER — MAGNESIUM SULFATE 2 GM/50ML IV SOLN
2.0000 g | Freq: Once | INTRAVENOUS | Status: AC
  Administered 2021-07-17: 2 g via INTRAVENOUS
  Filled 2021-07-17: qty 50

## 2021-07-17 MED ORDER — LEVETIRACETAM IN NACL 500 MG/100ML IV SOLN
500.0000 mg | Freq: Two times a day (BID) | INTRAVENOUS | Status: DC
  Administered 2021-07-18: 500 mg via INTRAVENOUS
  Filled 2021-07-17: qty 100

## 2021-07-17 MED ORDER — ENSURE ENLIVE PO LIQD
237.0000 mL | Freq: Two times a day (BID) | ORAL | Status: DC
  Administered 2021-07-18 – 2021-07-19 (×3): 237 mL via ORAL

## 2021-07-17 MED ORDER — LEVETIRACETAM 500 MG PO TABS
500.0000 mg | ORAL_TABLET | Freq: Two times a day (BID) | ORAL | Status: DC
  Administered 2021-07-17 – 2021-07-19 (×4): 500 mg via ORAL
  Filled 2021-07-17 (×4): qty 1

## 2021-07-17 NOTE — Progress Notes (Signed)
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   By: Allegra Lai M.D.   On: 07/14/2021 14:24
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
## 2021-07-17 NOTE — Procedures (Signed)
Routine EEG Report  Katrina Nichols is a 88 y.o. female with a history of seizure who is undergoing an EEG to evaluate for seizures.  Report: This EEG was acquired with electrodes placed according to the International 10-20 electrode system (including Fp1, Fp2, F3, F4, C3, C4, P3, P4, O1, O2, T3, T4, T5, T6, A1, A2, Fz, Cz, Pz). The following electrodes were missing or displaced: none.  The occipital dominant rhythm was 8.5 Hz. This activity is reactive to stimulation. Drowsiness was manifested by background fragmentation; deeper stages of sleep were identified by K complexes and sleep spindles. There was no focal slowing. There were no interictal epileptiform discharges. There were no electrographic seizures identified. There was no abnormal response to photic stimulation or hyperventilation.   Impression: This EEG was obtained while awake and asleep and is normal.    Clinical Correlation: Normal EEGs, however, do not rule out epilepsy.  Bing Neighbors, MD [facility] [phone]  If 7pm- 7am, please page neurology on call as listed in AMION.
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
## 2021-07-17 NOTE — Progress Notes (Signed)
EEG done at bedside. No skin breakdown noted. Results pending. 
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
## 2021-07-18 DIAGNOSIS — J9601 Acute respiratory failure with hypoxia: Secondary | ICD-10-CM | POA: Diagnosis not present

## 2021-07-18 DIAGNOSIS — N179 Acute kidney failure, unspecified: Secondary | ICD-10-CM | POA: Diagnosis not present

## 2021-07-18 DIAGNOSIS — R918 Other nonspecific abnormal finding of lung field: Secondary | ICD-10-CM | POA: Diagnosis not present

## 2021-07-18 LAB — CBC
HCT: 31 % — ABNORMAL LOW (ref 36.0–46.0)
Hemoglobin: 10.1 g/dL — ABNORMAL LOW (ref 12.0–15.0)
MCH: 31.1 pg (ref 26.0–34.0)
MCHC: 32.6 g/dL (ref 30.0–36.0)
MCV: 95.4 fL (ref 80.0–100.0)
Platelets: 166 10*3/uL (ref 150–400)
RBC: 3.25 MIL/uL — ABNORMAL LOW (ref 3.87–5.11)
RDW: 13.9 % (ref 11.5–15.5)
WBC: 7.7 10*3/uL (ref 4.0–10.5)
nRBC: 0 % (ref 0.0–0.2)

## 2021-07-18 LAB — BASIC METABOLIC PANEL
Anion gap: 8 (ref 5–15)
BUN: 14 mg/dL (ref 8–23)
CO2: 27 mmol/L (ref 22–32)
Calcium: 8.7 mg/dL — ABNORMAL LOW (ref 8.9–10.3)
Chloride: 98 mmol/L (ref 98–111)
Creatinine, Ser: 0.74 mg/dL (ref 0.44–1.00)
GFR, Estimated: >60 mL/min (ref >60)
Glucose, Bld: 93 mg/dL (ref 70–99)
Potassium: 3.7 mmol/L (ref 3.5–5.1)
Sodium: 133 mmol/L — ABNORMAL LOW (ref 135–145)

## 2021-07-18 LAB — BRAIN NATRIURETIC PEPTIDE: B Natriuretic Peptide: 175.3 pg/mL — ABNORMAL HIGH (ref 0.0–100.0)

## 2021-07-18 LAB — MAGNESIUM: Magnesium: 1.9 mg/dL (ref 1.7–2.4)

## 2021-07-18 NOTE — Progress Notes (Signed)
Brief Neuro Update and signoff note:  Routine EEG with no epileptiform discharges. Continue Keppra 500mg  BID for 6 weeks, then stop. Follow up with Neurology outpatient. Please see full consult note from 2/5 for complete recs.  We will signoff. Please feel free to contact with any questions or concerns.  [place] [facility] Pager Number Erick Blinks
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
## 2021-07-18 NOTE — Progress Notes (Signed)
Transition of Care [facility]) - CAGE-AID Screening   Patient Details  Name: BRAYLYN EYE MRN: [id number] Date of Birth: Dec 14, 1933  Transition of Care [facility]) CM/SW Contact:    Janora Norlander, RN Phone Number: 07/18/2021, 5:31 PM   Clinical Narrative: Pt denies alcohol or drug use.   CAGE-AID Screening:    Have You Ever Felt You Ought to Cut Down on Your Drinking or Drug Use?: No Have People Annoyed You By Critizing Your Drinking Or Drug Use?: No Have You Felt Bad Or Guilty About Your Drinking Or Drug Use?: No Have You Ever Had a Drink or Used Drugs First Thing In The Morning to Steady Your Nerves or to Get Rid of a Hangover?: No CAGE-AID Score: 0  Substance Abuse Education Offered: No

## 2021-07-18 NOTE — Progress Notes (Signed)
Transition of Care [facility]) Screening Note   Patient Details  Name: LYBERTI AFSHAR Date of Birth: September 04, 1933   Transition of Care [facility]) CM/SW Contact:    Harriet Masson, RN Phone Number: 07/18/2021, 9:05 AM    Transition of Care Department [facility]) has reviewed patient and no TOC needs have been identified at this time. We will continue to monitor patient advancement through interdisciplinary progression rounds. If new patient transition needs arise, please place a TOC consult.

## 2021-07-18 NOTE — Plan of Care (Signed)
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
## 2021-07-19 ENCOUNTER — Other Ambulatory Visit (HOSPITAL_COMMUNITY): Payer: Self-pay

## 2021-07-19 DIAGNOSIS — N179 Acute kidney failure, unspecified: Secondary | ICD-10-CM | POA: Diagnosis not present

## 2021-07-19 DIAGNOSIS — Z7401 Bed confinement status: Secondary | ICD-10-CM | POA: Diagnosis not present

## 2021-07-19 DIAGNOSIS — I959 Hypotension, unspecified: Secondary | ICD-10-CM | POA: Diagnosis not present

## 2021-07-19 DIAGNOSIS — R569 Unspecified convulsions: Secondary | ICD-10-CM | POA: Diagnosis not present

## 2021-07-19 DIAGNOSIS — I10 Essential (primary) hypertension: Secondary | ICD-10-CM | POA: Diagnosis not present

## 2021-07-19 DIAGNOSIS — J9601 Acute respiratory failure with hypoxia: Secondary | ICD-10-CM | POA: Diagnosis not present

## 2021-07-19 DIAGNOSIS — R918 Other nonspecific abnormal finding of lung field: Secondary | ICD-10-CM | POA: Diagnosis not present

## 2021-07-19 LAB — BRAIN NATRIURETIC PEPTIDE: B Natriuretic Peptide: 125 pg/mL — ABNORMAL HIGH (ref 0.0–100.0)

## 2021-07-19 LAB — MAGNESIUM: Magnesium: 1.9 mg/dL (ref 1.7–2.4)

## 2021-07-19 MED ORDER — LEVETIRACETAM 500 MG PO TABS
500.0000 mg | ORAL_TABLET | Freq: Two times a day (BID) | ORAL | 0 refills | Status: DC
Start: 2021-07-19 — End: 2021-08-25
  Filled 2021-07-19: qty 60, 30d supply, fill #0

## 2021-07-19 MED ORDER — VITAMIN D (ERGOCALCIFEROL) 1.25 MG (50000 UNIT) PO CAPS
50000.0000 [IU] | ORAL_CAPSULE | ORAL | 0 refills | Status: DC
  Filled 2021-07-19: qty 4, 28d supply, fill #0

## 2021-07-19 MED ORDER — OXYCODONE HCL 5 MG PO TABS
5.0000 mg | ORAL_TABLET | Freq: Three times a day (TID) | ORAL | 0 refills | Status: DC | PRN
  Filled 2021-07-19: qty 15, 5d supply, fill #0

## 2021-07-19 MED ORDER — ACETAMINOPHEN 500 MG PO TABS
500.0000 mg | ORAL_TABLET | Freq: Four times a day (QID) | ORAL | 0 refills | Status: AC | PRN
  Filled 2021-07-19: qty 25, 7d supply, fill #0
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
## 2021-07-19 NOTE — Discharge Instructions (Addendum)
Follow with Primary MD Elfredia Nevins, MD in 7 days   Get CBC, CMP, 2 view Chest X ray -  checked next visit within 1 week by Primary MD   Activity: As tolerated with Full fall precautions use walker/cane & assistance as needed  Disposition Home    Diet: Heart Healthy    Special Instructions: If you have smoked or chewed Tobacco  in the last 2 yrs please stop smoking, stop any regular Alcohol  and or any Recreational drug use.  On your next visit with your primary care physician please Get Medicines reviewed and adjusted.  Please request your Prim.MD to go over all Hospital Tests and Procedure/Radiological results at the follow up, please get all Hospital records sent to your Prim MD by signing hospital release before you go home.  If you experience worsening of your admission symptoms, develop shortness of breath, life threatening emergency, suicidal or homicidal thoughts you must seek medical attention immediately by calling 911 or calling your MD immediately  if symptoms less severe.  You Must read complete instructions/literature along with all the possible adverse reactions/side effects for all the Medicines you take and that have been prescribed to you. Take any new Medicines after you have completely understood and accpet all the possible adverse reactions/side effects.   Do not drive, operate heavy machinery, perform activities at heights, swimming or participation in water activities or provide baby sitting services if your were admitted for syncope or siezures until you have seen by Primary MD or a Neurologist and advised to do so again.

## 2021-07-19 NOTE — Discharge Summary (Addendum)
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 interictal epileptiform discharges. There were no electrographic seizures identified. There was no abnormal response to photic stimulation or hyperventilation. Impression: This EEG was obtained while awake and asleep and is normal.   Clinical Correlation: Normal EEGs, however, do not rule out epilepsy. Bing Neighbors, MD [facility] [phone] If 7pm- 7am, please page neurology on call as listed in AMION.   CT Maxillofacial WO CM  Result Date: 07/14/2021 CLINICAL DATA:  Facial trauma EXAM: CT HEAD WITHOUT CONTRAST CT MAXILLOFACIAL WITHOUT CONTRAST CT CERVICAL SPINE WITHOUT CONTRAST TECHNIQUE: Multidetector CT imaging of the head, cervical spine, and maxillofacial structures were performed using the standard protocol without intravenous contrast. Multiplanar CT image  reconstructions of the cervical spine and maxillofacial structures were also generated. RADIATION DOSE REDUCTION: This exam was performed according to the departmental dose-optimization program which includes automated exposure control, adjustment of the mA and/or kV according to patient size and/or use of iterative reconstruction technique. COMPARISON:  None. FINDINGS: CT HEAD FINDINGS Brain: Mild white  matter ischemic change. No evidence of acute infarction, hemorrhage, hydrocephalus, extra-axial collection or mass lesion/mass effect. Vascular: No hyperdense vessel or unexpected calcification. Skull: Normal. Negative for fracture or focal lesion. Other: None. CT MAXILLOFACIAL FINDINGS Osseous: No fracture or mandibular dislocation. No destructive process. Orbits: Negative. No traumatic or inflammatory finding. Sinuses: Clear. Soft tissues: Negative. CT CERVICAL SPINE FINDINGS Alignment: Mild grade 1 anterolisthesis of C4 on C5. No evidence of traumatic malalignment. Skull base and vertebrae: No acute fracture. No primary bone lesion or focal pathologic process. Soft tissues and spinal canal: No prevertebral fluid or swelling. No visible canal hematoma. Disc levels:  Mild multilevel degenerative disc disease. Upper chest: Negative. Other: None. IMPRESSION: 1. No acute intracranial abnormality. 2. No evidence facial bone fracture. 3. No evidence of cervical spine fracture or traumatic malalignment. Electronically Signed   By: Allegra Lai M.D.   On: 07/14/2021 14:24     Today   Subjective    Breanca Kilcrease today has no headache,no chest abdominal pain,no new weakness tingling or numbness, feels much better wants to go home today.     Objective   Blood pressure (!) 129/55, pulse 66, temperature 98.5 F (36.9 C), temperature source Oral, resp. rate 15, height 5' (1.524 m), weight 49 kg, SpO2 96 %.   Intake/Output Summary (Last 24 hours) at 07/19/2021 0940 Last data filed at 07/19/2021 0200 Gross per 24  hour  Intake --  Output 800 ml  Net -800 ml    Exam  Awake Alert, No new F.N deficits,    [place].AT,PERRAL Supple Neck,   Symmetrical Chest wall movement, Good air movement bilaterally, CTAB RRR,No Gallops,   +ve B.Sounds, Abd Soft, Non tender,  No Cyanosis, Clubbing or edema    Data Review   CBC w Diff:  Lab Results  Component Value Date   WBC 7.7 07/18/2021   HGB 10.1 (L) 07/18/2021   HCT 31.0 (L) 07/18/2021   PLT 166 07/18/2021   LYMPHOPCT 14 07/15/2021   MONOPCT 10 07/15/2021   EOSPCT 3 07/15/2021   BASOPCT 1 07/15/2021    CMP:  Lab Results  Component Value Date   NA 133 (L) 07/18/2021   K 3.7 07/18/2021   CL 98 07/18/2021   CO2 27 07/18/2021   BUN 14 07/18/2021   CREATININE 0.74 07/18/2021   PROT 7.3 07/15/2021   ALBUMIN 3.8 07/15/2021   BILITOT 0.7 07/15/2021   ALKPHOS 75 07/15/2021   AST 25 07/15/2021   ALT 22 07/15/2021  .   Total Time in preparing paper work, data evaluation and todays exam - 35 minutes  Susa Raring M.D on 07/19/2021 at 9:40 AM  [facility]

## 2021-07-19 NOTE — Progress Notes (Signed)
PT Cancellation Note  Patient Details Name: Katrina Nichols MRN: [id number] DOB: [birth year]   Cancelled Treatment:    Reason Eval/Treat Not Completed: Other (comment)  No family present for family education. Will reattempt later this date when family is here.    Arby Barrette, PT Acute Rehabilitation Services  Pager [phone] Office [phone]  Rexanne Mano 07/19/2021, 10:09 AM

## 2021-07-19 NOTE — TOC Transition Note (Addendum)
Transition of Care [facility]) - CM/SW Discharge Note   Patient Details  Name: Katrina Nichols MRN: [id number] Date of Birth: 1934/04/30  Transition of Care [facility]) CM/SW Contact:  Carles Collet, RN Phone Number: 07/19/2021, 11:29 AM   Clinical Narrative:   Damaris Schooner w patient and daughter at bedside. Oxygen and WC will be delivered to the room. They have 3/1 and RW at home already [facility] set up with [facility] states they will transport home, daughter and son will assist into house.  Spoke w nurse who voiced concern about getting patient into car, states he cannot assist nor can other staff. Discussed w daughter, Corey Harold set up. DME will still come to room for her to take home so that oxygen will be available for PTAR upon patient arriving home.      Final next level of care: [place] Barriers to Discharge: No Barriers Identified   Patient Goals and CMS Choice Patient states their goals for this hospitalization and ongoing recovery are:: to go home CMS Medicare.gov Compare Post Acute Care list provided to:: Other (Comment Required) Choice offered to / list presented to : Adult Children  Discharge Placement                       Discharge Plan and Services                DME Arranged: Lightweight manual wheelchair with seat cushion, Oxygen DME Agency: [employer] Date DME Agency Contacted: 07/19/21 Time DME Agency Contacted: 1129 Representative spoke with at DME Agency: [place]: PT, OT [place] Agency: [place] Date [place]: 07/19/21 Time [place]: 1129 Representative spoke with at [place]: [place] Determinants of Health ([place]) Interventions     Readmission Risk Interventions No flowsheet data found.
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
## 2021-07-19 NOTE — Progress Notes (Signed)
SATURATION QUALIFICATIONS: (This note is used to comply with regulatory documentation for home oxygen)  Patient Saturations on Room Air at Rest = <88%  Patient Saturations on Room Air while Ambulating =unable to ambulate  Patient Saturations on 2 Liters of oxygen while at Rest = 94%

## 2021-07-19 NOTE — Care Management (Signed)
°  °  Durable Medical Equipment  (From admission, onward)           Start     Ordered   07/19/21 1123  For home use only DME lightweight manual wheelchair with seat cushion  Once       Comments: Patient suffers from weakness which impairs their ability to perform daily activities like bathing in the home.  A cane will not resolve  issue with performing activities of daily living. A wheelchair will allow patient to safely perform daily activities. Patient is not able to propel themselves in the home using a standard weight wheelchair due to general weakness. Patient can self propel in the lightweight wheelchair. Length of need Lifetime. Accessories: elevating leg rests (ELRs), wheel locks, extensions and anti-tippers.   07/19/21 1123   07/19/21 1037  For home use only DME oxygen  Once       Question Answer Comment  Length of Need 6 Months   Mode or (Route) Nasal cannula   Liters per Minute 2   Frequency Continuous (stationary and portable oxygen unit needed)   Oxygen conserving device Yes   Oxygen delivery system Gas      07/19/21 1036   07/19/21 0934  DME Walker  Once       Question Answer Comment  Walker: With [place]   Patient needs a walker to treat with the following condition Weakness      07/19/21 0934

## 2021-07-20 ENCOUNTER — Other Ambulatory Visit (HOSPITAL_COMMUNITY): Payer: Self-pay

## 2021-07-20 DIAGNOSIS — M5030 Other cervical disc degeneration, unspecified cervical region: Secondary | ICD-10-CM | POA: Diagnosis not present

## 2021-07-20 DIAGNOSIS — M4312 Spondylolisthesis, cervical region: Secondary | ICD-10-CM | POA: Diagnosis not present

## 2021-07-20 DIAGNOSIS — M48061 Spinal stenosis, lumbar region without neurogenic claudication: Secondary | ICD-10-CM | POA: Diagnosis not present

## 2021-07-20 DIAGNOSIS — Z9181 History of falling: Secondary | ICD-10-CM | POA: Diagnosis not present

## 2021-07-20 DIAGNOSIS — S32512D Fracture of superior rim of left pubis, subsequent encounter for fracture with routine healing: Secondary | ICD-10-CM | POA: Diagnosis not present

## 2021-07-20 DIAGNOSIS — S32059D Unspecified fracture of fifth lumbar vertebra, subsequent encounter for fracture with routine healing: Secondary | ICD-10-CM | POA: Diagnosis not present

## 2021-07-20 DIAGNOSIS — R911 Solitary pulmonary nodule: Secondary | ICD-10-CM | POA: Diagnosis not present

## 2021-07-20 DIAGNOSIS — S3219XD Other fracture of sacrum, subsequent encounter for fracture with routine healing: Secondary | ICD-10-CM | POA: Diagnosis not present

## 2021-07-20 DIAGNOSIS — R0602 Shortness of breath: Secondary | ICD-10-CM | POA: Diagnosis not present

## 2021-07-20 DIAGNOSIS — W19XXXD Unspecified fall, subsequent encounter: Secondary | ICD-10-CM | POA: Diagnosis not present

## 2021-07-20 DIAGNOSIS — R569 Unspecified convulsions: Secondary | ICD-10-CM | POA: Diagnosis not present

## 2021-07-20 DIAGNOSIS — Z9981 Dependence on supplemental oxygen: Secondary | ICD-10-CM | POA: Diagnosis not present

## 2021-07-20 DIAGNOSIS — E785 Hyperlipidemia, unspecified: Secondary | ICD-10-CM | POA: Diagnosis not present

## 2021-07-20 DIAGNOSIS — S32592D Other specified fracture of left pubis, subsequent encounter for fracture with routine healing: Secondary | ICD-10-CM | POA: Diagnosis not present

## 2021-07-20 DIAGNOSIS — I10 Essential (primary) hypertension: Secondary | ICD-10-CM | POA: Diagnosis not present

## 2021-07-20 DIAGNOSIS — I700 Atherosclerosis of aorta: Secondary | ICD-10-CM | POA: Diagnosis not present

## 2021-07-22 DIAGNOSIS — S32592D Other specified fracture of left pubis, subsequent encounter for fracture with routine healing: Secondary | ICD-10-CM | POA: Diagnosis not present

## 2021-07-22 DIAGNOSIS — R569 Unspecified convulsions: Secondary | ICD-10-CM | POA: Diagnosis not present

## 2021-07-22 DIAGNOSIS — S3219XD Other fracture of sacrum, subsequent encounter for fracture with routine healing: Secondary | ICD-10-CM | POA: Diagnosis not present

## 2021-07-22 DIAGNOSIS — S32059D Unspecified fracture of fifth lumbar vertebra, subsequent encounter for fracture with routine healing: Secondary | ICD-10-CM | POA: Diagnosis not present

## 2021-07-22 DIAGNOSIS — S32512D Fracture of superior rim of left pubis, subsequent encounter for fracture with routine healing: Secondary | ICD-10-CM | POA: Diagnosis not present

## 2021-07-22 DIAGNOSIS — R0602 Shortness of breath: Secondary | ICD-10-CM | POA: Diagnosis not present

## 2021-07-25 DIAGNOSIS — E782 Mixed hyperlipidemia: Secondary | ICD-10-CM | POA: Diagnosis not present

## 2021-07-25 DIAGNOSIS — R569 Unspecified convulsions: Secondary | ICD-10-CM | POA: Diagnosis not present

## 2021-07-25 DIAGNOSIS — I10 Essential (primary) hypertension: Secondary | ICD-10-CM | POA: Diagnosis not present

## 2021-07-26 DIAGNOSIS — R0602 Shortness of breath: Secondary | ICD-10-CM | POA: Diagnosis not present

## 2021-07-26 DIAGNOSIS — S32512D Fracture of superior rim of left pubis, subsequent encounter for fracture with routine healing: Secondary | ICD-10-CM | POA: Diagnosis not present

## 2021-07-26 DIAGNOSIS — R569 Unspecified convulsions: Secondary | ICD-10-CM | POA: Diagnosis not present

## 2021-07-26 DIAGNOSIS — S3219XD Other fracture of sacrum, subsequent encounter for fracture with routine healing: Secondary | ICD-10-CM | POA: Diagnosis not present

## 2021-07-26 DIAGNOSIS — S32059D Unspecified fracture of fifth lumbar vertebra, subsequent encounter for fracture with routine healing: Secondary | ICD-10-CM | POA: Diagnosis not present

## 2021-07-26 DIAGNOSIS — S32592D Other specified fracture of left pubis, subsequent encounter for fracture with routine healing: Secondary | ICD-10-CM | POA: Diagnosis not present

## 2021-07-27 DIAGNOSIS — S3219XD Other fracture of sacrum, subsequent encounter for fracture with routine healing: Secondary | ICD-10-CM | POA: Diagnosis not present

## 2021-07-27 DIAGNOSIS — S32059D Unspecified fracture of fifth lumbar vertebra, subsequent encounter for fracture with routine healing: Secondary | ICD-10-CM | POA: Diagnosis not present

## 2021-07-27 DIAGNOSIS — R0602 Shortness of breath: Secondary | ICD-10-CM | POA: Diagnosis not present

## 2021-07-27 DIAGNOSIS — S32592D Other specified fracture of left pubis, subsequent encounter for fracture with routine healing: Secondary | ICD-10-CM | POA: Diagnosis not present

## 2021-07-27 DIAGNOSIS — S32512D Fracture of superior rim of left pubis, subsequent encounter for fracture with routine healing: Secondary | ICD-10-CM | POA: Diagnosis not present

## 2021-07-27 DIAGNOSIS — R569 Unspecified convulsions: Secondary | ICD-10-CM | POA: Diagnosis not present

## 2021-07-29 DIAGNOSIS — S3219XD Other fracture of sacrum, subsequent encounter for fracture with routine healing: Secondary | ICD-10-CM | POA: Diagnosis not present

## 2021-07-29 DIAGNOSIS — S32512D Fracture of superior rim of left pubis, subsequent encounter for fracture with routine healing: Secondary | ICD-10-CM | POA: Diagnosis not present

## 2021-07-29 DIAGNOSIS — S32059D Unspecified fracture of fifth lumbar vertebra, subsequent encounter for fracture with routine healing: Secondary | ICD-10-CM | POA: Diagnosis not present

## 2021-07-29 DIAGNOSIS — S32592D Other specified fracture of left pubis, subsequent encounter for fracture with routine healing: Secondary | ICD-10-CM | POA: Diagnosis not present

## 2021-07-30 DIAGNOSIS — R0602 Shortness of breath: Secondary | ICD-10-CM | POA: Diagnosis not present

## 2021-07-30 DIAGNOSIS — R569 Unspecified convulsions: Secondary | ICD-10-CM | POA: Diagnosis not present

## 2021-07-30 DIAGNOSIS — S3219XD Other fracture of sacrum, subsequent encounter for fracture with routine healing: Secondary | ICD-10-CM | POA: Diagnosis not present

## 2021-07-30 DIAGNOSIS — S32512D Fracture of superior rim of left pubis, subsequent encounter for fracture with routine healing: Secondary | ICD-10-CM | POA: Diagnosis not present

## 2021-07-30 DIAGNOSIS — S32059D Unspecified fracture of fifth lumbar vertebra, subsequent encounter for fracture with routine healing: Secondary | ICD-10-CM | POA: Diagnosis not present

## 2021-07-30 DIAGNOSIS — S32592D Other specified fracture of left pubis, subsequent encounter for fracture with routine healing: Secondary | ICD-10-CM | POA: Diagnosis not present

## 2021-08-02 DIAGNOSIS — S3219XD Other fracture of sacrum, subsequent encounter for fracture with routine healing: Secondary | ICD-10-CM | POA: Diagnosis not present

## 2021-08-02 DIAGNOSIS — S32512D Fracture of superior rim of left pubis, subsequent encounter for fracture with routine healing: Secondary | ICD-10-CM | POA: Diagnosis not present

## 2021-08-02 DIAGNOSIS — S32592D Other specified fracture of left pubis, subsequent encounter for fracture with routine healing: Secondary | ICD-10-CM | POA: Diagnosis not present

## 2021-08-02 DIAGNOSIS — R569 Unspecified convulsions: Secondary | ICD-10-CM | POA: Diagnosis not present

## 2021-08-02 DIAGNOSIS — S32059D Unspecified fracture of fifth lumbar vertebra, subsequent encounter for fracture with routine healing: Secondary | ICD-10-CM | POA: Diagnosis not present

## 2021-08-02 DIAGNOSIS — R0602 Shortness of breath: Secondary | ICD-10-CM | POA: Diagnosis not present

## 2021-08-03 DIAGNOSIS — S32512D Fracture of superior rim of left pubis, subsequent encounter for fracture with routine healing: Secondary | ICD-10-CM | POA: Diagnosis not present

## 2021-08-03 DIAGNOSIS — S32592D Other specified fracture of left pubis, subsequent encounter for fracture with routine healing: Secondary | ICD-10-CM | POA: Diagnosis not present

## 2021-08-03 DIAGNOSIS — S32059D Unspecified fracture of fifth lumbar vertebra, subsequent encounter for fracture with routine healing: Secondary | ICD-10-CM | POA: Diagnosis not present

## 2021-08-03 DIAGNOSIS — R569 Unspecified convulsions: Secondary | ICD-10-CM | POA: Diagnosis not present

## 2021-08-03 DIAGNOSIS — R0602 Shortness of breath: Secondary | ICD-10-CM | POA: Diagnosis not present

## 2021-08-03 DIAGNOSIS — S3219XD Other fracture of sacrum, subsequent encounter for fracture with routine healing: Secondary | ICD-10-CM | POA: Diagnosis not present

## 2021-08-04 DIAGNOSIS — R0602 Shortness of breath: Secondary | ICD-10-CM | POA: Diagnosis not present

## 2021-08-04 DIAGNOSIS — S32592D Other specified fracture of left pubis, subsequent encounter for fracture with routine healing: Secondary | ICD-10-CM | POA: Diagnosis not present

## 2021-08-04 DIAGNOSIS — S32059D Unspecified fracture of fifth lumbar vertebra, subsequent encounter for fracture with routine healing: Secondary | ICD-10-CM | POA: Diagnosis not present

## 2021-08-04 DIAGNOSIS — S3219XD Other fracture of sacrum, subsequent encounter for fracture with routine healing: Secondary | ICD-10-CM | POA: Diagnosis not present

## 2021-08-04 DIAGNOSIS — S32512D Fracture of superior rim of left pubis, subsequent encounter for fracture with routine healing: Secondary | ICD-10-CM | POA: Diagnosis not present

## 2021-08-04 DIAGNOSIS — R569 Unspecified convulsions: Secondary | ICD-10-CM | POA: Diagnosis not present

## 2021-08-04 DIAGNOSIS — M25552 Pain in left hip: Secondary | ICD-10-CM | POA: Diagnosis not present

## 2021-08-05 ENCOUNTER — Encounter: Payer: Self-pay | Admitting: Pulmonary Disease

## 2021-08-05 ENCOUNTER — Other Ambulatory Visit: Payer: Self-pay

## 2021-08-05 ENCOUNTER — Ambulatory Visit (INDEPENDENT_AMBULATORY_CARE_PROVIDER_SITE_OTHER): Payer: Medicare Other | Admitting: Pulmonary Disease

## 2021-08-05 VITALS — BP 114/60 | HR 97 | Temp 98.2°F | Ht 60.0 in | Wt 120.0 lb

## 2021-08-05 DIAGNOSIS — R918 Other nonspecific abnormal finding of lung field: Secondary | ICD-10-CM | POA: Diagnosis not present

## 2021-08-05 DIAGNOSIS — Z789 Other specified health status: Secondary | ICD-10-CM

## 2021-08-05 DIAGNOSIS — T07XXXA Unspecified multiple injuries, initial encounter: Secondary | ICD-10-CM

## 2021-08-05 NOTE — Patient Instructions (Signed)
Thank you for visiting Dr. Tonia Brooms at [facility]. Today we recommend the following:  Orders Placed This Encounter  Procedures   CT Super D Chest Wo Contrast   See [place] back after the CT Chest is complete.   Return in about 3 months (around 11/02/2021) for with Kandice Robinsons, NP, or Dr. Tonia Brooms.    Please do your part to reduce the spread of COVID-19.
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
## 2021-08-08 DIAGNOSIS — R569 Unspecified convulsions: Secondary | ICD-10-CM | POA: Diagnosis not present

## 2021-08-08 DIAGNOSIS — S32059D Unspecified fracture of fifth lumbar vertebra, subsequent encounter for fracture with routine healing: Secondary | ICD-10-CM | POA: Diagnosis not present

## 2021-08-08 DIAGNOSIS — S32512D Fracture of superior rim of left pubis, subsequent encounter for fracture with routine healing: Secondary | ICD-10-CM | POA: Diagnosis not present

## 2021-08-08 DIAGNOSIS — S32592D Other specified fracture of left pubis, subsequent encounter for fracture with routine healing: Secondary | ICD-10-CM | POA: Diagnosis not present

## 2021-08-08 DIAGNOSIS — S3219XD Other fracture of sacrum, subsequent encounter for fracture with routine healing: Secondary | ICD-10-CM | POA: Diagnosis not present

## 2021-08-08 DIAGNOSIS — R0602 Shortness of breath: Secondary | ICD-10-CM | POA: Diagnosis not present

## 2021-08-09 DIAGNOSIS — S32512D Fracture of superior rim of left pubis, subsequent encounter for fracture with routine healing: Secondary | ICD-10-CM | POA: Diagnosis not present

## 2021-08-09 DIAGNOSIS — S32592D Other specified fracture of left pubis, subsequent encounter for fracture with routine healing: Secondary | ICD-10-CM | POA: Diagnosis not present

## 2021-08-09 DIAGNOSIS — R0602 Shortness of breath: Secondary | ICD-10-CM | POA: Diagnosis not present

## 2021-08-09 DIAGNOSIS — S32059D Unspecified fracture of fifth lumbar vertebra, subsequent encounter for fracture with routine healing: Secondary | ICD-10-CM | POA: Diagnosis not present

## 2021-08-09 DIAGNOSIS — R569 Unspecified convulsions: Secondary | ICD-10-CM | POA: Diagnosis not present

## 2021-08-09 DIAGNOSIS — S3219XD Other fracture of sacrum, subsequent encounter for fracture with routine healing: Secondary | ICD-10-CM | POA: Diagnosis not present

## 2021-08-10 DIAGNOSIS — S32059D Unspecified fracture of fifth lumbar vertebra, subsequent encounter for fracture with routine healing: Secondary | ICD-10-CM | POA: Diagnosis not present

## 2021-08-10 DIAGNOSIS — R569 Unspecified convulsions: Secondary | ICD-10-CM | POA: Diagnosis not present

## 2021-08-10 DIAGNOSIS — S3219XD Other fracture of sacrum, subsequent encounter for fracture with routine healing: Secondary | ICD-10-CM | POA: Diagnosis not present

## 2021-08-10 DIAGNOSIS — S32000A Wedge compression fracture of unspecified lumbar vertebra, initial encounter for closed fracture: Secondary | ICD-10-CM | POA: Diagnosis not present

## 2021-08-10 DIAGNOSIS — S32592D Other specified fracture of left pubis, subsequent encounter for fracture with routine healing: Secondary | ICD-10-CM | POA: Diagnosis not present

## 2021-08-10 DIAGNOSIS — S32512D Fracture of superior rim of left pubis, subsequent encounter for fracture with routine healing: Secondary | ICD-10-CM | POA: Diagnosis not present

## 2021-08-10 DIAGNOSIS — R0602 Shortness of breath: Secondary | ICD-10-CM | POA: Diagnosis not present

## 2021-08-11 DIAGNOSIS — R569 Unspecified convulsions: Secondary | ICD-10-CM | POA: Diagnosis not present

## 2021-08-11 DIAGNOSIS — S32059D Unspecified fracture of fifth lumbar vertebra, subsequent encounter for fracture with routine healing: Secondary | ICD-10-CM | POA: Diagnosis not present

## 2021-08-11 DIAGNOSIS — R0602 Shortness of breath: Secondary | ICD-10-CM | POA: Diagnosis not present

## 2021-08-11 DIAGNOSIS — S3219XD Other fracture of sacrum, subsequent encounter for fracture with routine healing: Secondary | ICD-10-CM | POA: Diagnosis not present

## 2021-08-11 DIAGNOSIS — S32592D Other specified fracture of left pubis, subsequent encounter for fracture with routine healing: Secondary | ICD-10-CM | POA: Diagnosis not present

## 2021-08-11 DIAGNOSIS — S32512D Fracture of superior rim of left pubis, subsequent encounter for fracture with routine healing: Secondary | ICD-10-CM | POA: Diagnosis not present

## 2021-08-15 DIAGNOSIS — S32512D Fracture of superior rim of left pubis, subsequent encounter for fracture with routine healing: Secondary | ICD-10-CM | POA: Diagnosis not present

## 2021-08-15 DIAGNOSIS — R569 Unspecified convulsions: Secondary | ICD-10-CM | POA: Diagnosis not present

## 2021-08-15 DIAGNOSIS — S3219XD Other fracture of sacrum, subsequent encounter for fracture with routine healing: Secondary | ICD-10-CM | POA: Diagnosis not present

## 2021-08-15 DIAGNOSIS — S32059D Unspecified fracture of fifth lumbar vertebra, subsequent encounter for fracture with routine healing: Secondary | ICD-10-CM | POA: Diagnosis not present

## 2021-08-15 DIAGNOSIS — S32592D Other specified fracture of left pubis, subsequent encounter for fracture with routine healing: Secondary | ICD-10-CM | POA: Diagnosis not present

## 2021-08-15 DIAGNOSIS — R0602 Shortness of breath: Secondary | ICD-10-CM | POA: Diagnosis not present

## 2021-08-16 DIAGNOSIS — S32512D Fracture of superior rim of left pubis, subsequent encounter for fracture with routine healing: Secondary | ICD-10-CM | POA: Diagnosis not present

## 2021-08-16 DIAGNOSIS — R569 Unspecified convulsions: Secondary | ICD-10-CM | POA: Diagnosis not present

## 2021-08-16 DIAGNOSIS — S32059D Unspecified fracture of fifth lumbar vertebra, subsequent encounter for fracture with routine healing: Secondary | ICD-10-CM | POA: Diagnosis not present

## 2021-08-16 DIAGNOSIS — S32592D Other specified fracture of left pubis, subsequent encounter for fracture with routine healing: Secondary | ICD-10-CM | POA: Diagnosis not present

## 2021-08-16 DIAGNOSIS — S3219XD Other fracture of sacrum, subsequent encounter for fracture with routine healing: Secondary | ICD-10-CM | POA: Diagnosis not present

## 2021-08-16 DIAGNOSIS — R0602 Shortness of breath: Secondary | ICD-10-CM | POA: Diagnosis not present

## 2021-08-17 DIAGNOSIS — S32059D Unspecified fracture of fifth lumbar vertebra, subsequent encounter for fracture with routine healing: Secondary | ICD-10-CM | POA: Diagnosis not present

## 2021-08-17 DIAGNOSIS — S32512D Fracture of superior rim of left pubis, subsequent encounter for fracture with routine healing: Secondary | ICD-10-CM | POA: Diagnosis not present

## 2021-08-17 DIAGNOSIS — S32592D Other specified fracture of left pubis, subsequent encounter for fracture with routine healing: Secondary | ICD-10-CM | POA: Diagnosis not present

## 2021-08-17 DIAGNOSIS — R0602 Shortness of breath: Secondary | ICD-10-CM | POA: Diagnosis not present

## 2021-08-17 DIAGNOSIS — S3219XD Other fracture of sacrum, subsequent encounter for fracture with routine healing: Secondary | ICD-10-CM | POA: Diagnosis not present

## 2021-08-17 DIAGNOSIS — R569 Unspecified convulsions: Secondary | ICD-10-CM | POA: Diagnosis not present

## 2021-08-18 DIAGNOSIS — S3219XD Other fracture of sacrum, subsequent encounter for fracture with routine healing: Secondary | ICD-10-CM | POA: Diagnosis not present

## 2021-08-18 DIAGNOSIS — R569 Unspecified convulsions: Secondary | ICD-10-CM | POA: Diagnosis not present

## 2021-08-18 DIAGNOSIS — S32059D Unspecified fracture of fifth lumbar vertebra, subsequent encounter for fracture with routine healing: Secondary | ICD-10-CM | POA: Diagnosis not present

## 2021-08-18 DIAGNOSIS — R0602 Shortness of breath: Secondary | ICD-10-CM | POA: Diagnosis not present

## 2021-08-18 DIAGNOSIS — S32592D Other specified fracture of left pubis, subsequent encounter for fracture with routine healing: Secondary | ICD-10-CM | POA: Diagnosis not present

## 2021-08-18 DIAGNOSIS — S32512D Fracture of superior rim of left pubis, subsequent encounter for fracture with routine healing: Secondary | ICD-10-CM | POA: Diagnosis not present

## 2021-08-19 DIAGNOSIS — R911 Solitary pulmonary nodule: Secondary | ICD-10-CM | POA: Diagnosis not present

## 2021-08-19 DIAGNOSIS — I10 Essential (primary) hypertension: Secondary | ICD-10-CM | POA: Diagnosis not present

## 2021-08-19 DIAGNOSIS — M48061 Spinal stenosis, lumbar region without neurogenic claudication: Secondary | ICD-10-CM | POA: Diagnosis not present

## 2021-08-19 DIAGNOSIS — R569 Unspecified convulsions: Secondary | ICD-10-CM | POA: Diagnosis not present

## 2021-08-19 DIAGNOSIS — Z9181 History of falling: Secondary | ICD-10-CM | POA: Diagnosis not present

## 2021-08-19 DIAGNOSIS — W19XXXD Unspecified fall, subsequent encounter: Secondary | ICD-10-CM | POA: Diagnosis not present

## 2021-08-19 DIAGNOSIS — M5030 Other cervical disc degeneration, unspecified cervical region: Secondary | ICD-10-CM | POA: Diagnosis not present

## 2021-08-19 DIAGNOSIS — Z9981 Dependence on supplemental oxygen: Secondary | ICD-10-CM | POA: Diagnosis not present

## 2021-08-19 DIAGNOSIS — I700 Atherosclerosis of aorta: Secondary | ICD-10-CM | POA: Diagnosis not present

## 2021-08-19 DIAGNOSIS — S32059D Unspecified fracture of fifth lumbar vertebra, subsequent encounter for fracture with routine healing: Secondary | ICD-10-CM | POA: Diagnosis not present

## 2021-08-19 DIAGNOSIS — M4312 Spondylolisthesis, cervical region: Secondary | ICD-10-CM | POA: Diagnosis not present

## 2021-08-19 DIAGNOSIS — R0602 Shortness of breath: Secondary | ICD-10-CM | POA: Diagnosis not present

## 2021-08-19 DIAGNOSIS — S32592D Other specified fracture of left pubis, subsequent encounter for fracture with routine healing: Secondary | ICD-10-CM | POA: Diagnosis not present

## 2021-08-19 DIAGNOSIS — S32512D Fracture of superior rim of left pubis, subsequent encounter for fracture with routine healing: Secondary | ICD-10-CM | POA: Diagnosis not present

## 2021-08-19 DIAGNOSIS — S3219XD Other fracture of sacrum, subsequent encounter for fracture with routine healing: Secondary | ICD-10-CM | POA: Diagnosis not present

## 2021-08-19 DIAGNOSIS — E785 Hyperlipidemia, unspecified: Secondary | ICD-10-CM | POA: Diagnosis not present

## 2021-08-22 DIAGNOSIS — R0602 Shortness of breath: Secondary | ICD-10-CM | POA: Diagnosis not present

## 2021-08-22 DIAGNOSIS — S32059D Unspecified fracture of fifth lumbar vertebra, subsequent encounter for fracture with routine healing: Secondary | ICD-10-CM | POA: Diagnosis not present

## 2021-08-22 DIAGNOSIS — S32512D Fracture of superior rim of left pubis, subsequent encounter for fracture with routine healing: Secondary | ICD-10-CM | POA: Diagnosis not present

## 2021-08-22 DIAGNOSIS — R569 Unspecified convulsions: Secondary | ICD-10-CM | POA: Diagnosis not present

## 2021-08-22 DIAGNOSIS — S32592D Other specified fracture of left pubis, subsequent encounter for fracture with routine healing: Secondary | ICD-10-CM | POA: Diagnosis not present

## 2021-08-22 DIAGNOSIS — S3219XD Other fracture of sacrum, subsequent encounter for fracture with routine healing: Secondary | ICD-10-CM | POA: Diagnosis not present

## 2021-08-23 DIAGNOSIS — Z681 Body mass index (BMI) 19 or less, adult: Secondary | ICD-10-CM | POA: Diagnosis not present

## 2021-08-23 DIAGNOSIS — S32592D Other specified fracture of left pubis, subsequent encounter for fracture with routine healing: Secondary | ICD-10-CM | POA: Diagnosis not present

## 2021-08-23 DIAGNOSIS — R569 Unspecified convulsions: Secondary | ICD-10-CM | POA: Diagnosis not present

## 2021-08-23 DIAGNOSIS — S32512D Fracture of superior rim of left pubis, subsequent encounter for fracture with routine healing: Secondary | ICD-10-CM | POA: Diagnosis not present

## 2021-08-23 DIAGNOSIS — S3219XD Other fracture of sacrum, subsequent encounter for fracture with routine healing: Secondary | ICD-10-CM | POA: Diagnosis not present

## 2021-08-23 DIAGNOSIS — E782 Mixed hyperlipidemia: Secondary | ICD-10-CM | POA: Diagnosis not present

## 2021-08-23 DIAGNOSIS — E559 Vitamin D deficiency, unspecified: Secondary | ICD-10-CM | POA: Diagnosis not present

## 2021-08-23 DIAGNOSIS — I10 Essential (primary) hypertension: Secondary | ICD-10-CM | POA: Diagnosis not present

## 2021-08-23 DIAGNOSIS — S32059D Unspecified fracture of fifth lumbar vertebra, subsequent encounter for fracture with routine healing: Secondary | ICD-10-CM | POA: Diagnosis not present

## 2021-08-23 DIAGNOSIS — R0602 Shortness of breath: Secondary | ICD-10-CM | POA: Diagnosis not present

## 2021-08-24 DIAGNOSIS — R569 Unspecified convulsions: Secondary | ICD-10-CM | POA: Diagnosis not present

## 2021-08-24 DIAGNOSIS — S32512D Fracture of superior rim of left pubis, subsequent encounter for fracture with routine healing: Secondary | ICD-10-CM | POA: Diagnosis not present

## 2021-08-24 DIAGNOSIS — S3219XD Other fracture of sacrum, subsequent encounter for fracture with routine healing: Secondary | ICD-10-CM | POA: Diagnosis not present

## 2021-08-24 DIAGNOSIS — S32592D Other specified fracture of left pubis, subsequent encounter for fracture with routine healing: Secondary | ICD-10-CM | POA: Diagnosis not present

## 2021-08-24 DIAGNOSIS — S32059D Unspecified fracture of fifth lumbar vertebra, subsequent encounter for fracture with routine healing: Secondary | ICD-10-CM | POA: Diagnosis not present

## 2021-08-24 DIAGNOSIS — R0602 Shortness of breath: Secondary | ICD-10-CM | POA: Diagnosis not present

## 2021-08-25 ENCOUNTER — Other Ambulatory Visit (HOSPITAL_COMMUNITY)
Admission: RE | Admit: 2021-08-25 | Discharge: 2021-08-25 | Disposition: A | Discharge status: Home / Self Care | Payer: Medicare Other | Source: Ambulatory Visit | Attending: Obstetrics & Gynecology | Admitting: Obstetrics & Gynecology

## 2021-08-25 ENCOUNTER — Other Ambulatory Visit: Payer: Self-pay

## 2021-08-25 ENCOUNTER — Encounter: Payer: Self-pay | Admitting: Obstetrics & Gynecology

## 2021-08-25 ENCOUNTER — Ambulatory Visit (INDEPENDENT_AMBULATORY_CARE_PROVIDER_SITE_OTHER): Payer: Medicare Other | Admitting: Obstetrics & Gynecology

## 2021-08-25 VITALS — BP 113/65 | HR 88 | Ht 60.0 in | Wt 101.2 lb

## 2021-08-25 DIAGNOSIS — N898 Other specified noninflammatory disorders of vagina: Secondary | ICD-10-CM | POA: Diagnosis not present

## 2021-08-25 DIAGNOSIS — N950 Postmenopausal bleeding: Secondary | ICD-10-CM
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
## 2021-08-26 DIAGNOSIS — M25552 Pain in left hip: Secondary | ICD-10-CM | POA: Diagnosis not present

## 2021-08-29 DIAGNOSIS — S32592D Other specified fracture of left pubis, subsequent encounter for fracture with routine healing: Secondary | ICD-10-CM | POA: Diagnosis not present

## 2021-08-29 DIAGNOSIS — S32059D Unspecified fracture of fifth lumbar vertebra, subsequent encounter for fracture with routine healing: Secondary | ICD-10-CM | POA: Diagnosis not present

## 2021-08-29 DIAGNOSIS — R569 Unspecified convulsions: Secondary | ICD-10-CM | POA: Diagnosis not present

## 2021-08-29 DIAGNOSIS — S32512D Fracture of superior rim of left pubis, subsequent encounter for fracture with routine healing: Secondary | ICD-10-CM | POA: Diagnosis not present

## 2021-08-29 DIAGNOSIS — R0602 Shortness of breath: Secondary | ICD-10-CM | POA: Diagnosis not present

## 2021-08-29 DIAGNOSIS — S3219XD Other fracture of sacrum, subsequent encounter for fracture with routine healing: Secondary | ICD-10-CM | POA: Diagnosis not present

## 2021-08-29 LAB — CERVICOVAGINAL ANCILLARY ONLY
Bacterial Vaginitis (gardnerella): NEGATIVE
Candida Glabrata: NEGATIVE
Candida Vaginitis: NEGATIVE
Comment: NEGATIVE
Comment: NEGATIVE
Comment: NEGATIVE

## 2021-08-30 ENCOUNTER — Other Ambulatory Visit: Payer: Self-pay | Admitting: Obstetrics & Gynecology

## 2021-08-30 ENCOUNTER — Ambulatory Visit (HOSPITAL_COMMUNITY)
Admission: RE | Admit: 2021-08-30 | Discharge: 2021-08-30 | Disposition: A | Discharge status: Home / Self Care | Payer: Medicare Other | Source: Ambulatory Visit | Attending: Obstetrics & Gynecology | Admitting: Obstetrics & Gynecology

## 2021-08-30 ENCOUNTER — Other Ambulatory Visit: Payer: Self-pay

## 2021-08-30 DIAGNOSIS — S32592D Other specified fracture of left pubis, subsequent encounter for fracture with routine healing: Secondary | ICD-10-CM | POA: Diagnosis not present

## 2021-08-30 DIAGNOSIS — S32059D Unspecified fracture of fifth lumbar vertebra, subsequent encounter for fracture with routine healing: Secondary | ICD-10-CM | POA: Diagnosis not present

## 2021-08-30 DIAGNOSIS — R0602 Shortness of breath: Secondary | ICD-10-CM | POA: Diagnosis not present

## 2021-08-30 DIAGNOSIS — N950 Postmenopausal bleeding: Secondary | ICD-10-CM | POA: Diagnosis not present

## 2021-08-30 DIAGNOSIS — R569 Unspecified convulsions: Secondary | ICD-10-CM | POA: Diagnosis not present

## 2021-08-30 DIAGNOSIS — S32512D Fracture of superior rim of left pubis, subsequent encounter for fracture with routine healing: Secondary | ICD-10-CM | POA: Diagnosis not present

## 2021-08-30 DIAGNOSIS — S3219XD Other fracture of sacrum, subsequent encounter for fracture with routine healing: Secondary | ICD-10-CM | POA: Diagnosis not present

## 2021-08-31 ENCOUNTER — Telehealth: Payer: Self-pay | Admitting: *Deleted

## 2021-08-31 NOTE — Telephone Encounter (Signed)
Spoke with daughter, Agustin Cree, and informed swab negative for infection.  States Shella is still having some brown discharge.  Had pelvic ultrasound yesterday but I informed her that I was unsure if Dr Charlotta Newton had reviewed the report.  Informed I would send message and have Dr Charlotta Newton review.   ?

## 2021-09-01 NOTE — Telephone Encounter (Signed)
Called Katrina Nichols, her daughter and reviewed [place] report.  Discussed normal endometrium.  Discussed incidental finding of ovarian cyst.  Though the cyst is present, suspect benign finding.  Prior imaging reviewed, including prior CT which was negative.  []  Plan for repeat in our office in 2-27mos.  Questions and concerns were addressed and agreeable to above. ? ?0mo, DO ?Attending Obstetrician & Gynecologist, Faculty Practice ?Center for [facility], [facility] ? ?

## 2021-09-05 DIAGNOSIS — S3219XD Other fracture of sacrum, subsequent encounter for fracture with routine healing: Secondary | ICD-10-CM | POA: Diagnosis not present

## 2021-09-05 DIAGNOSIS — R569 Unspecified convulsions: Secondary | ICD-10-CM | POA: Diagnosis not present

## 2021-09-05 DIAGNOSIS — S32512D Fracture of superior rim of left pubis, subsequent encounter for fracture with routine healing: Secondary | ICD-10-CM | POA: Diagnosis not present

## 2021-09-05 DIAGNOSIS — S32592D Other specified fracture of left pubis, subsequent encounter for fracture with routine healing: Secondary | ICD-10-CM | POA: Diagnosis not present

## 2021-09-05 DIAGNOSIS — R0602 Shortness of breath: Secondary | ICD-10-CM | POA: Diagnosis not present

## 2021-09-05 DIAGNOSIS — S32059D Unspecified fracture of fifth lumbar vertebra, subsequent encounter for fracture with routine healing: Secondary | ICD-10-CM | POA: Diagnosis not present

## 2021-09-06 DIAGNOSIS — R0602 Shortness of breath: Secondary | ICD-10-CM | POA: Diagnosis not present

## 2021-09-06 DIAGNOSIS — R569 Unspecified convulsions: Secondary | ICD-10-CM | POA: Diagnosis not present

## 2021-09-06 DIAGNOSIS — S32592D Other specified fracture of left pubis, subsequent encounter for fracture with routine healing: Secondary | ICD-10-CM | POA: Diagnosis not present

## 2021-09-06 DIAGNOSIS — S3219XD Other fracture of sacrum, subsequent encounter for fracture with routine healing: Secondary | ICD-10-CM | POA: Diagnosis not present

## 2021-09-06 DIAGNOSIS — S32512D Fracture of superior rim of left pubis, subsequent encounter for fracture with routine healing: Secondary | ICD-10-CM | POA: Diagnosis not present

## 2021-09-06 DIAGNOSIS — S32059D Unspecified fracture of fifth lumbar vertebra, subsequent encounter for fracture with routine healing: Secondary | ICD-10-CM | POA: Diagnosis not present

## 2021-09-08 DIAGNOSIS — H402221 Chronic angle-closure glaucoma, left eye, mild stage: Secondary | ICD-10-CM | POA: Diagnosis not present

## 2021-09-08 DIAGNOSIS — H2101 Hyphema, right eye: Secondary | ICD-10-CM | POA: Diagnosis not present

## 2021-09-08 DIAGNOSIS — H02015 Cicatricial entropion of left lower eyelid: Secondary | ICD-10-CM | POA: Diagnosis not present

## 2021-09-08 DIAGNOSIS — H02032 Senile entropion of right lower eyelid: Secondary | ICD-10-CM | POA: Diagnosis not present

## 2021-09-08 DIAGNOSIS — H402213 Chronic angle-closure glaucoma, right eye, severe stage: Secondary | ICD-10-CM | POA: Diagnosis not present

## 2021-09-13 DIAGNOSIS — R569 Unspecified convulsions: Secondary | ICD-10-CM | POA: Diagnosis not present

## 2021-09-13 DIAGNOSIS — S3219XD Other fracture of sacrum, subsequent encounter for fracture with routine healing: Secondary | ICD-10-CM | POA: Diagnosis not present

## 2021-09-13 DIAGNOSIS — S32592D Other specified fracture of left pubis, subsequent encounter for fracture with routine healing: Secondary | ICD-10-CM | POA: Diagnosis not present

## 2021-09-13 DIAGNOSIS — S32512D Fracture of superior rim of left pubis, subsequent encounter for fracture with routine healing: Secondary | ICD-10-CM | POA: Diagnosis not present

## 2021-09-13 DIAGNOSIS — S32059D Unspecified fracture of fifth lumbar vertebra, subsequent encounter for fracture with routine healing: Secondary | ICD-10-CM | POA: Diagnosis not present

## 2021-09-13 DIAGNOSIS — R0602 Shortness of breath: Secondary | ICD-10-CM | POA: Diagnosis not present

## 2021-09-23 ENCOUNTER — Other Ambulatory Visit: Payer: Self-pay | Admitting: Obstetrics & Gynecology

## 2021-09-23 DIAGNOSIS — N83201 Unspecified ovarian cyst, right side: Secondary | ICD-10-CM

## 2021-09-26 ENCOUNTER — Encounter: Payer: Self-pay | Admitting: Obstetrics & Gynecology

## 2021-09-26 ENCOUNTER — Ambulatory Visit (INDEPENDENT_AMBULATORY_CARE_PROVIDER_SITE_OTHER): Payer: Medicare Other

## 2021-09-26 ENCOUNTER — Ambulatory Visit (INDEPENDENT_AMBULATORY_CARE_PROVIDER_SITE_OTHER): Payer: Medicare Other | Admitting: Obstetrics & Gynecology

## 2021-09-26 VITALS — BP 138/68 | HR 69 | Ht 60.0 in | Wt 98.8 lb

## 2021-09-26 DIAGNOSIS — N950 Postmenopausal bleeding: Secondary | ICD-10-CM | POA: Diagnosis not present

## 2021-09-26 DIAGNOSIS — N83201 Unspecified ovarian cyst, right side: Secondary | ICD-10-CM | POA: Diagnosis not present

## 2021-09-26 DIAGNOSIS — N857 Hematometra: Secondary | ICD-10-CM | POA: Diagnosis not present

## 2021-09-26 NOTE — Progress Notes (Signed)
PELVIC US TA/TV: heterogeneous retroverted uterus with uterine calcifications,complex fluid filled endometrium (hematometra),two echogenic masses within the endometrium(? Polyps) (#1) 6.7 x 6.2 mm,(#2) 6.5 x 5 mm,EEC 3 mm,normal ovaries (limited view),no free fluid  ? ?Chaperone Tish ?
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
## 2021-10-10 DIAGNOSIS — H02032 Senile entropion of right lower eyelid: Secondary | ICD-10-CM | POA: Diagnosis not present

## 2021-10-28 ENCOUNTER — Ambulatory Visit
Admission: RE | Admit: 2021-10-28 | Discharge: 2021-10-28 | Disposition: A | Discharge status: Home / Self Care | Payer: Medicare Other | Source: Ambulatory Visit | Attending: Pulmonary Disease | Admitting: Pulmonary Disease

## 2021-10-28 DIAGNOSIS — R918 Other nonspecific abnormal finding of lung field: Secondary | ICD-10-CM

## 2021-10-28 DIAGNOSIS — R911 Solitary pulmonary nodule: Secondary | ICD-10-CM | POA: Diagnosis not present

## 2021-10-28 DIAGNOSIS — I700 Atherosclerosis of aorta: Secondary | ICD-10-CM | POA: Diagnosis not present

## 2021-11-04 ENCOUNTER — Ambulatory Visit (INDEPENDENT_AMBULATORY_CARE_PROVIDER_SITE_OTHER): Payer: Medicare Other | Admitting: Acute Care

## 2021-11-04 ENCOUNTER — Encounter: Payer: Self-pay | Admitting: Acute Care

## 2021-11-04 VITALS — BP 110/62 | HR 71 | Temp 98.4°F | Ht 60.0 in | Wt 98.0 lb

## 2021-11-04 DIAGNOSIS — R918 Other nonspecific abnormal finding of lung field: Secondary | ICD-10-CM

## 2021-11-04 NOTE — Patient Instructions (Signed)
It is good to see you today.  Your CT Chest looks better. The nodules has become smaller or totally resolved. .  We will repeat the CT Chest without contrast in 6 months to check these areas again. ( Schedule first week in December)  Follow up appointment with Dr. Valeta Harms of Christoher Drudge NP after CT Chest has been completed. Just let us know if you need [place] before. Please contact office for sooner follow up if symptoms do not improve or worsen or seek emergency care
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
## 2022-02-06 DIAGNOSIS — R569 Unspecified convulsions: Secondary | ICD-10-CM | POA: Diagnosis not present

## 2022-02-06 DIAGNOSIS — Z1331 Encounter for screening for depression: Secondary | ICD-10-CM | POA: Diagnosis not present

## 2022-02-06 DIAGNOSIS — R7309 Other abnormal glucose: Secondary | ICD-10-CM | POA: Diagnosis not present

## 2022-02-06 DIAGNOSIS — E782 Mixed hyperlipidemia: Secondary | ICD-10-CM | POA: Diagnosis not present

## 2022-02-06 DIAGNOSIS — Z0001 Encounter for general adult medical examination with abnormal findings: Secondary | ICD-10-CM | POA: Diagnosis not present

## 2022-02-06 DIAGNOSIS — I10 Essential (primary) hypertension: Secondary | ICD-10-CM | POA: Diagnosis not present

## 2022-02-06 DIAGNOSIS — Z6820 Body mass index (BMI) 20.0-20.9, adult: Secondary | ICD-10-CM | POA: Diagnosis not present

## 2022-03-27 DIAGNOSIS — H402221 Chronic angle-closure glaucoma, left eye, mild stage: Secondary | ICD-10-CM | POA: Diagnosis not present

## 2022-03-27 DIAGNOSIS — H02032 Senile entropion of right lower eyelid: Secondary | ICD-10-CM | POA: Diagnosis not present

## 2022-03-27 DIAGNOSIS — H4089 Other specified glaucoma: Secondary | ICD-10-CM | POA: Diagnosis not present

## 2022-03-27 DIAGNOSIS — H402213 Chronic angle-closure glaucoma, right eye, severe stage: Secondary | ICD-10-CM | POA: Diagnosis not present

## 2022-04-04 DIAGNOSIS — Z23 Encounter for immunization: Secondary | ICD-10-CM | POA: Diagnosis not present

## 2022-04-05 DIAGNOSIS — Z23 Encounter for immunization: Secondary | ICD-10-CM | POA: Diagnosis not present

## 2022-05-15 DIAGNOSIS — H402213 Chronic angle-closure glaucoma, right eye, severe stage: Secondary | ICD-10-CM | POA: Diagnosis not present

## 2022-05-15 DIAGNOSIS — H402221 Chronic angle-closure glaucoma, left eye, mild stage: Secondary | ICD-10-CM | POA: Diagnosis not present

## 2022-05-15 DIAGNOSIS — H02032 Senile entropion of right lower eyelid: Secondary | ICD-10-CM | POA: Diagnosis not present

## 2022-05-17 ENCOUNTER — Ambulatory Visit (HOSPITAL_COMMUNITY)
Admission: RE | Admit: 2022-05-17 | Discharge: 2022-05-17 | Disposition: A | Discharge status: Home / Self Care | Payer: Medicare Other | Source: Ambulatory Visit | Attending: Family Medicine | Admitting: Family Medicine

## 2022-05-17 DIAGNOSIS — R918 Other nonspecific abnormal finding of lung field: Secondary | ICD-10-CM | POA: Diagnosis not present

## 2022-05-17 DIAGNOSIS — J9811 Atelectasis: Secondary | ICD-10-CM | POA: Diagnosis not present

## 2022-05-24 ENCOUNTER — Ambulatory Visit (INDEPENDENT_AMBULATORY_CARE_PROVIDER_SITE_OTHER): Payer: Medicare Other | Admitting: Pulmonary Disease

## 2022-05-24 ENCOUNTER — Encounter: Payer: Self-pay | Admitting: Pulmonary Disease

## 2022-05-24 VITALS — BP 120/60 | HR 69 | Ht 60.0 in | Wt 109.0 lb

## 2022-05-24 DIAGNOSIS — R918 Other nonspecific abnormal finding of lung field: Secondary | ICD-10-CM | POA: Diagnosis not present

## 2022-05-24 DIAGNOSIS — Z789 Other specified health status: Secondary | ICD-10-CM

## 2022-05-24 NOTE — Patient Instructions (Addendum)
Thank you for visiting Dr. Tonia Brooms at [facility]. Today we recommend the following:  No additional follow up images needed   Return if symptoms worsen or fail to improve.    Please do your part to reduce the spread of COVID-19.

## 2022-05-24 NOTE — Progress Notes (Signed)
Synopsis: Referred in February 2023 for lung nodules by Assunta Found, MD  Subjective:   PATIENT ID: Katrina Nichols GENDER: female DOB: 1934-03-15, MRN: [id number]  Chief Complaint  Patient presents with   Follow-up    F/up CT    This is a 88 year old female, past medical history of hypertension, hypercholesterolemia, lifelong non-smoker.Patient was referred for evaluation of lung nodules after CT chest abdomen pelvis completed on 07/14/2021.  Patient was found to have biapical pleural-parenchymal densities and scarring measuring approximately 1.3 cm in largest cross-section in the right upper lobe along the right major fissure.  There is also a pleural-based density of approximately 0.8 x 0.4 cm along the lingula and a nodular density in the left lower lobe at about 1.1 x 0.6 cm.  OV 05/24/2022: Here today for follow-up.  Recent CT scan of the chest at the beginning of December 2023.  Reveals resolution of the previous lung nodules seen on her CT imaging.  From respiratory standpoint she has no complaints.  She is going daily to check on her husband at the [facility].  As of Friday they will be married 63 years.    Past Medical History:  Diagnosis Date   Hypercholesteremia    Hypertension      Family History  Problem Relation Age of Onset   Heart failure Father    Alzheimer's disease Mother    Alzheimer's disease Brother    Alzheimer's disease Brother    Alzheimer's disease Sister    Alcohol abuse Daughter    Drug abuse Daughter    Diabetes Daughter    Diabetes Son      Past Surgical History:  Procedure Laterality Date   OVARIAN CYST REMOVAL     TUBAL LIGATION      Social History   Socioeconomic History   Marital status: Married    Spouse name: Not on file   Number of children: Not on file   Years of education: Not on file   Highest education level: Not on file  Occupational History   Not on file  Tobacco Use   Smoking status: Never   Smokeless tobacco: Never   Vaping Use   Vaping Use: Never used  Substance and Sexual Activity   Alcohol use: No   Drug use: No   Sexual activity: Not Currently    Birth control/protection: Post-menopausal    Comment: tubal  Other Topics Concern   Not on file  Social History Narrative   Not on file   Social Determinants of Health   Financial Resource Strain: Low Risk  (08/25/2021)   Overall Financial Resource Strain (CARDIA)    Difficulty of Paying Living Expenses: Not hard at all  Food Insecurity: No Food Insecurity (08/25/2021)   Hunger Vital Sign    Worried About Running Out of Food in the Last Year: Never true    Ran Out of Food in the Last Year: Never true  Transportation Needs: No Transportation Needs (08/25/2021)   PRAPARE - Administrator, Civil Service (Medical): No    Lack of Transportation (Non-Medical): No  Physical Activity: Inactive (08/25/2021)   Exercise Vital Sign    Days of Exercise per Week: 0 days    Minutes of Exercise per Session: 0 min  Stress: No Stress Concern Present (08/25/2021)   [employer] of Occupational Health - Occupational Stress Questionnaire    Feeling of Stress : Not at all  Social Connections: Moderately Isolated (08/25/2021)   Social Connection  and Isolation Panel [NHANES]    Frequency of Communication with Friends and Family: More than three times a week    Frequency of Social Gatherings with Friends and Family: More than three times a week    Attends Religious Services: Never    Database administrator or Organizations: No    Attends Banker Meetings: Never    Marital Status: Married  Catering manager Violence: Not At Risk (08/25/2021)   Humiliation, Afraid, Rape, and Kick questionnaire    Fear of Current or Ex-Partner: No    Emotionally Abused: No    Physically Abused: No    Sexually Abused: No     No Known Allergies   Outpatient Medications Prior to Visit  Medication Sig Dispense Refill   acetaminophen (TYLENOL) 500 MG tablet  Take 1 tablet (500 mg total) by mouth every 6 (six) hours as needed for mild pain or moderate pain (or Fever >/= 101). 25 tablet 0   lovastatin (MEVACOR) 20 MG tablet Take 20 mg by mouth at bedtime.       triamterene-hydrochlorothiazide (MAXZIDE-25) 37.5-25 MG tablet Take 1 tablet by mouth daily.     No facility-administered medications prior to visit.    Review of Systems  Constitutional:  Negative for chills, fever, malaise/fatigue and weight loss.  HENT:  Negative for hearing loss, sore throat and tinnitus.   Eyes:  Negative for blurred vision and double vision.  Respiratory:  Negative for cough, hemoptysis, sputum production, shortness of breath, wheezing and stridor.   Cardiovascular:  Negative for chest pain, palpitations, orthopnea, leg swelling and PND.  Gastrointestinal:  Negative for abdominal pain, constipation, diarrhea, heartburn, nausea and vomiting.  Genitourinary:  Negative for dysuria, hematuria and urgency.  Musculoskeletal:  Negative for joint pain and myalgias.  Skin:  Negative for itching and rash.  Neurological:  Negative for dizziness, tingling, weakness and headaches.  Endo/Heme/Allergies:  Negative for environmental allergies. Does not bruise/bleed easily.  Psychiatric/Behavioral:  Negative for depression. The patient is not nervous/anxious and does not have insomnia.   All other systems reviewed and are negative.    Objective:  Physical Exam Vitals reviewed.  Constitutional:      General: She is not in acute distress.    Appearance: She is well-developed.  HENT:     Head: Normocephalic and atraumatic.  Eyes:     General: No scleral icterus.    Conjunctiva/sclera: Conjunctivae normal.     Pupils: Pupils are equal, round, and reactive to light.  Neck:     Vascular: No JVD.     Trachea: No tracheal deviation.  Cardiovascular:     Rate and Rhythm: Normal rate and regular rhythm.     Heart sounds: Normal heart sounds. No murmur heard. Pulmonary:      Effort: Pulmonary effort is normal. No tachypnea, accessory muscle usage or respiratory distress.     Breath sounds: No stridor. No wheezing, rhonchi or rales.  Abdominal:     General: There is no distension.     Palpations: Abdomen is soft.     Tenderness: There is no abdominal tenderness.  Musculoskeletal:        General: No tenderness.     Cervical back: Neck supple.  Lymphadenopathy:     Cervical: No cervical adenopathy.  Skin:    General: Skin is warm and dry.     Capillary Refill: Capillary refill takes less than 2 seconds.     Findings: No rash.  Neurological:  Mental Status: She is alert and oriented to person, place, and time.  Psychiatric:        Behavior: Behavior normal.      Vitals:   05/24/22 1352  BP: 120/60  Pulse: 69  SpO2: 99%  Weight: 109 lb (49.4 kg)  Height: 5' (1.524 m)   99% on RA BMI Readings from Last 3 Encounters:  05/24/22 21.29 kg/m  11/04/21 19.14 kg/m  09/26/21 19.30 kg/m   Wt Readings from Last 3 Encounters:  05/24/22 109 lb (49.4 kg)  11/04/21 98 lb (44.5 kg)  09/26/21 98 lb 12.8 oz (44.8 kg)     CBC    Component Value Date/Time   WBC 7.7 07/18/2021 0250   RBC 3.25 (L) 07/18/2021 0250   HGB 10.1 (L) 07/18/2021 0250   HCT 31.0 (L) 07/18/2021 0250   PLT 166 07/18/2021 0250   MCV 95.4 07/18/2021 0250   MCH 31.1 07/18/2021 0250   MCHC 32.6 07/18/2021 0250   RDW 13.9 07/18/2021 0250   LYMPHSABS 1.5 07/15/2021 1555   MONOABS 1.1 (H) 07/15/2021 1555   EOSABS 0.3 07/15/2021 1555   BASOSABS 0.1 07/15/2021 1555    Chest Imaging: 07/14/2021 CT chest: Patient has multiple bilateral pulmonary nodules largest at approximately 1.3 cm another 1.1 cm. The patient's images have been independently reviewed by me.    05/17/2022 CT chest: No significant pulmonary nodules noted.  Some subsegmental atelectasis.  Previous lesions that were seen before are improved. The patient's images have been independently reviewed by me.     Pulmonary Functions Testing Results:     No data to display          FeNO:   Pathology:   Echocardiogram:   Heart Catheterization:     Assessment & Plan:     ICD-10-CM   1. Multiple pulmonary nodules  R91.8     2. Non-smoker  Z78.9       Discussion:  This is an 88 year old female, initially found to have incidental pulmonary nodules after trauma evaluation.  Lifelong non-smoker.  Recent CT imaging shows resolution of previous nodules.  Plan: No additional follow-up needed for her CT imaging. These nodules have resolved. She can return to routine follow-up care with her primary care provider. Patient return to clinic here as needed.    Current Outpatient Medications:    acetaminophen (TYLENOL) 500 MG tablet, Take 1 tablet (500 mg total) by mouth every 6 (six) hours as needed for mild pain or moderate pain (or Fever >/= 101)., Disp: 25 tablet, Rfl: 0   lovastatin (MEVACOR) 20 MG tablet, Take 20 mg by mouth at bedtime.  , Disp: , Rfl:    triamterene-hydrochlorothiazide (MAXZIDE-25) 37.5-25 MG tablet, Take 1 tablet by mouth daily., Disp: , Rfl:     Josephine Igo, DO [facility] 05/24/2022 2:09 PM

## 2022-11-03 ENCOUNTER — Encounter (HOSPITAL_COMMUNITY): Payer: Self-pay | Admitting: Emergency Medicine

## 2022-11-03 ENCOUNTER — Emergency Department (HOSPITAL_COMMUNITY): Payer: Medicare Other

## 2022-11-03 ENCOUNTER — Emergency Department (HOSPITAL_COMMUNITY)
Admission: EM | Admit: 2022-11-03 | Discharge: 2022-11-04 | Disposition: A | Discharge status: Home / Self Care | Payer: Medicare Other | Attending: Emergency Medicine | Admitting: Emergency Medicine

## 2022-11-03 ENCOUNTER — Other Ambulatory Visit: Payer: Self-pay

## 2022-11-03 DIAGNOSIS — L03115 Cellulitis of right lower limb: Secondary | ICD-10-CM | POA: Diagnosis not present

## 2022-11-03 DIAGNOSIS — M79671 Pain in right foot: Secondary | ICD-10-CM | POA: Diagnosis present

## 2022-11-03 DIAGNOSIS — I10 Essential (primary) hypertension: Secondary | ICD-10-CM | POA: Insufficient documentation

## 2022-11-03 DIAGNOSIS — R053 Chronic cough: Secondary | ICD-10-CM | POA: Diagnosis not present

## 2022-11-03 DIAGNOSIS — M2011 Hallux valgus (acquired), right foot: Secondary | ICD-10-CM | POA: Diagnosis not present

## 2022-11-03 DIAGNOSIS — L89810 Pressure ulcer of head, unstageable: Secondary | ICD-10-CM | POA: Diagnosis not present

## 2022-11-03 DIAGNOSIS — M109 Gout, unspecified: Secondary | ICD-10-CM | POA: Insufficient documentation

## 2022-11-03 DIAGNOSIS — Z6821 Body mass index (BMI) 21.0-21.9, adult: Secondary | ICD-10-CM | POA: Diagnosis not present

## 2022-11-03 DIAGNOSIS — M7989 Other specified soft tissue disorders: Secondary | ICD-10-CM | POA: Diagnosis not present

## 2022-11-03 DIAGNOSIS — R030 Elevated blood-pressure reading, without diagnosis of hypertension: Secondary | ICD-10-CM | POA: Diagnosis not present

## 2022-11-03 DIAGNOSIS — L03811 Cellulitis of head [any part, except face]: Secondary | ICD-10-CM | POA: Diagnosis not present

## 2022-11-03 LAB — CBC WITH DIFFERENTIAL/PLATELET
Abs Immature Granulocytes: 0.05 10*3/uL (ref 0.00–0.07)
Basophils Absolute: 0.1 10*3/uL (ref 0.0–0.1)
Basophils Relative: 1 %
Eosinophils Absolute: 0.2 10*3/uL (ref 0.0–0.5)
Eosinophils Relative: 1 %
HCT: 44 % (ref 36.0–46.0)
Hemoglobin: 14.4 g/dL (ref 12.0–15.0)
Immature Granulocytes: 0 %
Lymphocytes Relative: 27 %
Lymphs Abs: 3.5 10*3/uL (ref 0.7–4.0)
MCH: 31.2 pg (ref 26.0–34.0)
MCHC: 32.7 g/dL (ref 30.0–36.0)
MCV: 95.2 fL (ref 80.0–100.0)
Monocytes Absolute: 1.1 10*3/uL — ABNORMAL HIGH (ref 0.1–1.0)
Monocytes Relative: 8 %
Neutro Abs: 8.1 10*3/uL — ABNORMAL HIGH (ref 1.7–7.7)
Neutrophils Relative %: 63 %
Platelets: 295 10*3/uL (ref 150–400)
RBC: 4.62 MIL/uL (ref 3.87–5.11)
RDW: 13.8 % (ref 11.5–15.5)
WBC: 13 10*3/uL — ABNORMAL HIGH (ref 4.0–10.5)
nRBC: 0 % (ref 0.0–0.2)

## 2022-11-03 LAB — COMPREHENSIVE METABOLIC PANEL
ALT: 11 U/L (ref 0–44)
AST: 19 U/L (ref 15–41)
Albumin: 3.7 g/dL (ref 3.5–5.0)
Alkaline Phosphatase: 82 U/L (ref 38–126)
Anion gap: 13 (ref 5–15)
BUN: 20 mg/dL (ref 8–23)
CO2: 25 mmol/L (ref 22–32)
Calcium: 10.2 mg/dL (ref 8.9–10.3)
Chloride: 97 mmol/L — ABNORMAL LOW (ref 98–111)
Creatinine, Ser: 0.96 mg/dL (ref 0.44–1.00)
GFR, Estimated: 57 mL/min — ABNORMAL LOW (ref >60)
Glucose, Bld: 102 mg/dL — ABNORMAL HIGH (ref 70–99)
Potassium: 4 mmol/L (ref 3.5–5.1)
Sodium: 135 mmol/L (ref 135–145)
Total Bilirubin: 0.9 mg/dL (ref 0.3–1.2)
Total Protein: 8.4 g/dL — ABNORMAL HIGH (ref 6.5–8.1)

## 2022-11-03 LAB — LACTIC ACID, PLASMA: Lactic Acid, Venous: 1.2 mmol/L (ref 0.5–1.9)

## 2022-11-03 LAB — URIC ACID: Uric Acid, Serum: 7.9 mg/dL — ABNORMAL HIGH (ref 2.5–7.1)

## 2022-11-03 MED ORDER — CEPHALEXIN 500 MG PO CAPS
500.0000 mg | ORAL_CAPSULE | ORAL | Status: AC
  Administered 2022-11-03: 500 mg via ORAL
  Filled 2022-11-03: qty 1

## 2022-11-03 MED ORDER — COLCHICINE 0.6 MG PO TABS
1.2000 mg | ORAL_TABLET | ORAL | Status: AC
  Administered 2022-11-03: 1.2 mg via ORAL
  Filled 2022-11-03: qty 2

## 2022-11-03 MED ORDER — COLCHICINE 0.6 MG PO TABS
0.6000 mg | ORAL_TABLET | ORAL | Status: AC
  Administered 2022-11-03: 0.6 mg via ORAL
  Filled 2022-11-03: qty 1

## 2022-11-03 MED ORDER — IBUPROFEN 800 MG PO TABS
800.0000 mg | ORAL_TABLET | Freq: Once | ORAL | Status: AC
  Administered 2022-11-03: 800 mg via ORAL
  Filled 2022-11-03: qty 1

## 2022-11-03 NOTE — ED Provider Notes (Signed)
[place] EMERGENCY DEPARTMENT AT [facility] Provider Note   CSN: [id number] Arrival date & time: 11/03/22  1812     History {Add pertinent medical, surgical, social history, OB history to HPI:1} Chief Complaint  Patient presents with   Cellulitis    Katrina Nichols is a 89 y.o. female.  Fot hurting for weeks. Yesterday swelling. Toes have been red for weeks as well. No fevers.    Pt via POV sent from [facility] for treatment of cellulitis to  right foot and lower leg, cellulitis to posterior scalp, and chronic  cough. Pain rated 8/10.   R great toe, swollen  Past Medical History: No date: Hypercholesteremia No date: Hypertension  No hx of gout       Home Medications Prior to Admission medications   Medication Sig Start Date End Date Taking? Authorizing Provider  acetaminophen (TYLENOL) 500 MG tablet Take 1 tablet (500 mg total) by mouth every 6 (six) hours as needed for mild pain or moderate pain (or Fever >/= 101). 07/19/21   Leroy Sea, MD  lovastatin (MEVACOR) 20 MG tablet Take 20 mg by mouth at bedtime.      [provider]  triamterene-hydrochlorothiazide (MAXZIDE-25) 37.5-25 MG tablet Take 1 tablet by mouth daily. 07/07/21   [provider]      Allergies    Patient has no known allergies.    Review of Systems   Review of Systems  Physical Exam Updated Vital Signs BP (!) 173/75 (BP Location: Right Arm)   Pulse 71   Temp 98.6 F (37 C) (Oral)   Resp 16   Ht 5' (1.524 m)   Wt 49.9 kg   SpO2 94%   BMI 21.48 kg/m  Physical Exam  ED Results / Procedures / Treatments   Labs (all labs ordered are listed, but only abnormal results are displayed) Labs Reviewed  COMPREHENSIVE METABOLIC PANEL - Abnormal; Notable for the following components:      Result Value   Chloride 97 (*)    Glucose, Bld 102 (*)    Total Protein 8.4 (*)    GFR, Estimated 57 (*)    All other components within normal limits  CBC WITH  DIFFERENTIAL/PLATELET - Abnormal; Notable for the following components:   WBC 13.0 (*)    Neutro Abs 8.1 (*)    Monocytes Absolute 1.1 (*)    All other components within normal limits  LACTIC ACID, PLASMA  LACTIC ACID, PLASMA  URINALYSIS, ROUTINE W REFLEX MICROSCOPIC    EKG None  Radiology DG Chest 2 View  Result Date: 11/03/2022 CLINICAL DATA:  Infection. Chronic cough. EXAM: CHEST - 2 VIEW COMPARISON:  Chest radiograph 07/15/2021. Chest CT 05/17/2022 FINDINGS: The heart is normal in size. Stable mediastinal contours. Aortic atherosclerosis. No acute airspace disease. Normal pulmonary vasculature. No pleural effusion or pneumothorax. On limited assessment, no acute osseous findings. IMPRESSION: No acute abnormality. Electronically Signed   By: Narda Rutherford M.D.   On: 11/03/2022 20:21    Procedures Procedures  {Document cardiac monitor, telemetry assessment procedure when appropriate:1}  Medications Ordered in ED Medications - No data to display  ED Course/ Medical Decision Making/ A&P   {   Click here for ABCD2, HEART and other calculatorsREFRESH Note before signing :1}                          Medical Decision Making Amount and/or Complexity of Data Reviewed Labs: ordered.  Radiology: ordered.   ***  {Document critical care time when appropriate:1} {Document review of labs and clinical decision tools ie heart score, Chads2Vasc2 etc:1}  {Document your independent review of radiology images, and any outside records:1} {Document your discussion with family members, caretakers, and with consultants:1} {Document social determinants of health affecting pt's care:1} {Document your decision making why or why not admission, treatments were needed:1} Final Clinical Impression(s) / ED Diagnoses Final diagnoses:  None    Rx / DC Orders ED Discharge Orders     None

## 2022-11-03 NOTE — ED Provider Triage Note (Signed)
Emergency Medicine Provider Triage Evaluation Note  Katrina Nichols , a 89 y.o. female  was evaluated in triage.  Pt complains of swelling and redness right foot and a sore on hr head.  Review of Systems  Positive: Swollen red foot Negative:   Physical Exam  BP (!) 173/75 (BP Location: Right Arm)   Pulse 71   Temp 98.6 F (37 C) (Oral)   Resp 16   Ht 5' (1.524 m)   Wt 49.9 kg   SpO2 94%   BMI 21.48 kg/m  Gen:   Awake, no distress   Resp:  Normal effort  MSK:   Moves extremities, swollen red right foot Other:    Medical Decision Making  Medically screening exam initiated at 7:15 PM.  Appropriate orders placed.  SHELI DINGESS was informed that the remainder of the evaluation will be completed by another provider, this initial triage assessment does not replace that evaluation, and the importance of remaining in the ED until their evaluation is complete.     Elson Areas, [place] 11/03/22 4098

## 2022-11-03 NOTE — ED Triage Notes (Signed)
Pt via POV sent from [facility] for treatment of cellulitis to right foot and lower leg, cellulitis to posterior scalp, and chronic cough. Pain rated 8/10.

## 2022-11-04 ENCOUNTER — Emergency Department (HOSPITAL_COMMUNITY): Payer: Medicare Other

## 2022-11-04 DIAGNOSIS — M2011 Hallux valgus (acquired), right foot: Secondary | ICD-10-CM | POA: Diagnosis not present

## 2022-11-04 DIAGNOSIS — L03115 Cellulitis of right lower limb: Secondary | ICD-10-CM | POA: Diagnosis not present

## 2022-11-04 LAB — URINALYSIS, ROUTINE W REFLEX MICROSCOPIC
Bilirubin Urine: NEGATIVE
Glucose, UA: NEGATIVE mg/dL
Ketones, ur: NEGATIVE mg/dL
Nitrite: NEGATIVE
Protein, ur: 30 mg/dL — AB
RBC / HPF: >50 RBC/hpf (ref 0–5)
Specific Gravity, Urine: 1.014 (ref 1.005–1.030)
WBC, UA: >50 WBC/hpf (ref 0–5)
pH: 5 (ref 5.0–8.0)

## 2022-11-04 MED ORDER — INDOMETHACIN 50 MG PO CAPS: 50.0000 mg | ORAL_CAPSULE | Freq: Three times a day (TID) | ORAL | 0 refills | Status: AC

## 2022-11-04 MED ORDER — BENZONATATE 100 MG PO CAPS: 100.0000 mg | ORAL_CAPSULE | Freq: Three times a day (TID) | ORAL | 0 refills | Status: AC

## 2022-11-04 MED ORDER — PREDNISONE 10 MG (21) PO TBPK: ORAL_TABLET | Freq: Every day | ORAL | 0 refills | Status: AC

## 2022-11-04 MED ORDER — CEPHALEXIN 500 MG PO CAPS: 500.0000 mg | ORAL_CAPSULE | Freq: Two times a day (BID) | ORAL | 0 refills | Status: AC

## 2022-11-04 NOTE — Discharge Instructions (Addendum)
You were seen for your foot pain in the emergency department.  It is likely that you have gout.  Read the information about this attached to these discharge papers.  I have sent 2 medications to your pharmacy for the gout.  The first is prednisone.  This is a steroid.  It may increase your appetite or cause some mood disturbances.it is going to be important for you to take this if you are able to tolerate it.  The second medication called indomethacin.  This is for your pain and inflammation.  You may have overlying cellulitis so please also take the Keflex you were prescribed.  This may upset your stomach so take it with food.  Follow-up with your primary doctor in 2-3 days regarding your visit.  You may talk about the blood in your urine at that time.  Return immediately to the emergency department if you experience any of the following: Fevers, worsening pain, or any other concerning symptoms.    Thank you for visiting our Emergency Department. It was a pleasure taking care of you today.

## 2022-11-04 NOTE — ED Notes (Signed)
Pt transported to MRI 
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
## 2022-11-13 DIAGNOSIS — H02032 Senile entropion of right lower eyelid: Secondary | ICD-10-CM | POA: Diagnosis not present

## 2022-11-13 DIAGNOSIS — H402221 Chronic angle-closure glaucoma, left eye, mild stage: Secondary | ICD-10-CM | POA: Diagnosis not present

## 2022-11-13 DIAGNOSIS — H402213 Chronic angle-closure glaucoma, right eye, severe stage: Secondary | ICD-10-CM | POA: Diagnosis not present

## 2022-12-07 DIAGNOSIS — H402221 Chronic angle-closure glaucoma, left eye, mild stage: Secondary | ICD-10-CM | POA: Diagnosis not present

## 2022-12-07 DIAGNOSIS — H402213 Chronic angle-closure glaucoma, right eye, severe stage: Secondary | ICD-10-CM | POA: Diagnosis not present

## 2022-12-07 DIAGNOSIS — H2512 Age-related nuclear cataract, left eye: Secondary | ICD-10-CM | POA: Diagnosis not present

## 2023-01-18 ENCOUNTER — Encounter (HOSPITAL_COMMUNITY)
Admission: RE | Admit: 2023-01-18 | Discharge: 2023-01-18 | Disposition: A | Discharge status: Home / Self Care | Payer: Medicare Other | Source: Ambulatory Visit | Attending: Ophthalmology | Admitting: Ophthalmology

## 2023-01-18 NOTE — H&P (Signed)
Surgical History & Physical  Patient Name: Katrina Nichols  DOB: 04/05/1934  Surgery: Cataract extraction with intraocular lens implant phacoemulsification; Left Eye Surgeon: Fabio Pierce MD Surgery Date: 01/22/2023 Pre-Op Date: 12/07/2022  HPI: A 28 Yr. old female patient presents for cataract eval. 1. The patient complains of difficulty when reading fine print, books, newspaper, instructions etc./reading small captions on TV, recognizing faces, which began 6 months ago. The left eye is affected. The episode is gradual. The condition's severity increased since last visit. Symptoms occur when the patient is inside, outside and reading. The complaint is associated with glare. This is negatively affecting the patient's quality of life and the patient is unable to function adequately in life with the current level of vision. Patient is following medication instructions; dorzolamide/timolol OD bid. HPI was performed by Fabio Pierce .  Medical History: Glaucoma Cataracts  High Blood Pressure LDL  Social Never smoked  Medication Dorzolamide/Timolol,  Triamterene/HCTZ, Lovastatin  Sx/Procedures Eyelid - Epilation of Cilia, Eyelid - Entropion Repair by Suture Technique,  Hip aand pelvis fracture R and L   Drug Allergies  NKDA  Review of Systems Negative Allergic/Immunologic Negative Cardiovascular Negative Constitutional Negative Ear, Nose, Mouth & Throat Negative Endocrine Negative Eyes Negative Gastrointestinal Negative Genitourinary Negative Hematologic/Lymphatic Negative Integumentary Negative Musculoskeletal Negative Neurological Negative Psychiatry Negative Respiratory  History & Physical: Heent: cataract NECK: supple without bruits LUNGS: lungs clear to auscultation CV: regular rate and rhythm Abdomen: soft and non-tender  Impression & Plan: Assessment: 1.  NUCLEAR SCLEROSIS AGE RELATED; Left Eye (H25.12) 2.  CHRONIC ANGLE CLOSURE GLAUCOMA; Right Eye Severe, Left  Eye Mild (H40.2213, H40.2221) 3.  ENTROPION SENILE; Right Lower Lid (H02.032) 4.  NEOVASCULAR GLAUCOMA ; Right Eye (H40.89) 5.  BLINDNESS RIGHT EYE CATEGORY 5, NORMAL VISION LEFT EYE (H54.415A) 6.  BLEPHARITIS; Right Upper Lid, Right Lower Lid, Left Upper Lid, Left Lower Lid (H01.001, H01.002,H01.004,H01.005)  Plan: 1.  Cataract accounts for the patient's decreased vision. This visual impairment is not correctable with a tolerable change in glasses or contact lenses. Cataract surgery with an implantation of a new lens should significantly improve the visual and functional status of the patient. Discussed all risks, benefits, alternatives, and potential complications. Discussed the procedures and recovery. Patient desires to have surgery. A-scan ordered and performed today for intra-ocular lens calculations. The surgery will be performed in order to improve vision for driving, reading, and for eye examinations. Recommend phacoemulsification with intra-ocular lens. Recommend Dextenza for post-operative pain and inflammation. Also recommend surgery to help prevent acute glaucoma attack - left eye. Left Eye. Dilates well - shugarcaine by protocol.  2.  NLP OD. S/P LPI. Patent. Angle appears shallow with minimal angle structures seen OS. OCT rNFL WNL OS 3/23 HVF 24-2 essentially WNL OS 3/23 Switched to generic Cosopt 1 drop OU BID. Continue Cosopt 1 drop both eyes every 12 hours. IOPs at goal OU today (OD is blind eye) Monitor. Detailed discussion about glaucoma today including importance of maintaining good follow up and following treatment plan, and the possibility of irreversible blindness as part of this disease process.  3.  S/P Quickert Repair. Excellent lid position. Has healed well. Call with any worsening vision, pain, or any other concerns.  4.  as above.  5.  Monocular precautions discussed, including wearing shatterproof lenses. Likely secondary to acute glaucoma attack  OD.  6.  Blepharitis is present - recommend regular lid cleaning.

## 2023-01-20 ENCOUNTER — Emergency Department (HOSPITAL_COMMUNITY)
Admission: EM | Admit: 2023-01-20 | Discharge: 2023-01-20 | Disposition: A | Discharge status: Home / Self Care | Payer: Medicare Other | Attending: Emergency Medicine | Admitting: Emergency Medicine

## 2023-01-20 ENCOUNTER — Encounter (HOSPITAL_COMMUNITY): Payer: Self-pay

## 2023-01-20 ENCOUNTER — Emergency Department (HOSPITAL_COMMUNITY): Payer: Medicare Other

## 2023-01-20 ENCOUNTER — Other Ambulatory Visit: Payer: Self-pay

## 2023-01-20 DIAGNOSIS — R29890 Loss of height: Secondary | ICD-10-CM | POA: Diagnosis not present

## 2023-01-20 DIAGNOSIS — M8580 Other specified disorders of bone density and structure, unspecified site: Secondary | ICD-10-CM | POA: Diagnosis not present

## 2023-01-20 DIAGNOSIS — S32010A Wedge compression fracture of first lumbar vertebra, initial encounter for closed fracture: Secondary | ICD-10-CM

## 2023-01-20 DIAGNOSIS — S32019A Unspecified fracture of first lumbar vertebra, initial encounter for closed fracture: Secondary | ICD-10-CM | POA: Diagnosis not present

## 2023-01-20 DIAGNOSIS — W19XXXA Unspecified fall, initial encounter: Secondary | ICD-10-CM | POA: Diagnosis not present

## 2023-01-20 DIAGNOSIS — M48061 Spinal stenosis, lumbar region without neurogenic claudication: Secondary | ICD-10-CM | POA: Diagnosis not present

## 2023-01-20 DIAGNOSIS — S3992XA Unspecified injury of lower back, initial encounter: Secondary | ICD-10-CM | POA: Diagnosis present

## 2023-01-20 MED ORDER — HYDROCODONE-ACETAMINOPHEN 5-325 MG PO TABS
1.0000 | ORAL_TABLET | Freq: Once | ORAL | Status: AC
  Administered 2023-01-20: 1 via ORAL
  Filled 2023-01-20: qty 1

## 2023-01-20 MED ORDER — TRAMADOL HCL 50 MG PO TABS: 50.0000 mg | ORAL_TABLET | Freq: Four times a day (QID) | ORAL | 0 refills | Status: AC | PRN

## 2023-01-20 MED ORDER — ONDANSETRON 8 MG PO TBDP
8.0000 mg | ORAL_TABLET | Freq: Once | ORAL | Status: AC
  Administered 2023-01-20: 8 mg via ORAL
  Filled 2023-01-20: qty 1

## 2023-01-20 NOTE — ED Notes (Signed)
Called [facility] for tlso back brace

## 2023-01-20 NOTE — Discharge Instructions (Addendum)
You were seen in the emergency department for evaluation of back pain from a fall.  You had a CAT scan of your spine that showed a lumbar compression fracture.  Neurosurgery is recommending a lumbar brace for comfort and support.  Follow-up with them in 2 to 4 weeks.  Tylenol for pain. Tramadol for severe pain. May cause constipation, may need a stool softener. Return if any worsening or concerning symptoms

## 2023-01-20 NOTE — Progress Notes (Signed)
Case d/w Dr. Jake Samples. Pt reportedly had fall 2d ago w/ ass'd increased LBP and presented to ED. CT reviewed showing presumably new L1 fx w/ 40% height loss and moderate retropulsion resulting in mod central stenosis. She is reportedly neuro intact. Recommend LSO and outpt f/u 2-4weeks. Call w/ questions/concerns.  Katrina Nichols, [facility]

## 2023-01-20 NOTE — ED Triage Notes (Signed)
Pt lives with her daughter and pt fell Thurs night . Daughter was not aware and pt voiced her back hurt and told daughter she fell. No bruising voiced by daughter. Pt is able to still ambulate but is slower than normal.

## 2023-01-20 NOTE — ED Provider Notes (Signed)
[place] EMERGENCY DEPARTMENT AT [facility] Provider Note   CSN: [id number] Arrival date & time: 01/20/23  5643     History  Chief Complaint  Patient presents with   Katrina Nichols    Katrina Nichols is a 89 y.o. female.  She is brought in by her daughter for evaluation of low back pain that is been going on for a few days.  She had a fall 2 days ago.  She denies loss of consciousness and she said she could get up after the fall.  Since then she has had low back pain worse with movement standing up.  Using Tylenol for pain.  Denies other injuries or complaints.  The history is provided by the patient and a relative.  Fall This is a new problem. The current episode started 2 days ago. The problem has not changed since onset.Pertinent negatives include no chest pain, no abdominal pain, no headaches and no shortness of breath. Associated symptoms comments: Low back pain. The symptoms are aggravated by bending, twisting and walking. Nothing relieves the symptoms. She has tried acetaminophen for the symptoms. The treatment provided no relief.       Home Medications Prior to Admission medications   Medication Sig Start Date End Date Taking? Authorizing Provider  acetaminophen (TYLENOL) 500 MG tablet Take 1 tablet (500 mg total) by mouth every 6 (six) hours as needed for mild pain or moderate pain (or Fever >/= 101). 07/19/21   Leroy Sea, MD  benzonatate (TESSALON) 100 MG capsule Take 1 capsule (100 mg total) by mouth every 8 (eight) hours. 11/04/22   Rondel Baton, MD  lovastatin (MEVACOR) 20 MG tablet Take 20 mg by mouth at bedtime.      [provider]  predniSONE (STERAPRED UNI-PAK 21 TAB) 10 MG (21) TBPK tablet Take by mouth daily. Take 6 tabs by mouth daily  for 2 days, then 5 tabs for 2 days, then 4 tabs for 2 days, then 3 tabs for 2 days, 2 tabs for 2 days, then 1 tab by mouth daily for 2 days 11/04/22   Redwine, Madison A, PA-C  triamterene-hydrochlorothiazide  (MAXZIDE-25) 37.5-25 MG tablet Take 1 tablet by mouth daily. 07/07/21   [provider]      Allergies    Patient has no known allergies.    Review of Systems   Review of Systems  Constitutional:  Negative for fever.  Respiratory:  Negative for shortness of breath.   Cardiovascular:  Negative for chest pain.  Gastrointestinal:  Negative for abdominal pain.  Genitourinary:  Negative for dysuria.  Musculoskeletal:  Positive for back pain. Negative for neck pain.  Skin:  Negative for wound.  Neurological:  Negative for headaches.    Physical Exam Updated Vital Signs BP (!) 168/66   Pulse 61   Temp 97.6 F (36.4 C)   Resp 17   Ht 5' (1.524 m)   Wt 49.9 kg   SpO2 92%   BMI 21.48 kg/m  Physical Exam Vitals and nursing note reviewed.  Constitutional:      General: She is not in acute distress.    Appearance: Normal appearance. She is well-developed.  HENT:     Head: Normocephalic and atraumatic.  Eyes:     Conjunctiva/sclera: Conjunctivae normal.  Cardiovascular:     Rate and Rhythm: Normal rate and regular rhythm.     Heart sounds: No murmur heard. Pulmonary:     Effort: Pulmonary effort is normal. No respiratory  distress.     Breath sounds: Normal breath sounds.  Abdominal:     Palpations: Abdomen is soft.     Tenderness: There is no abdominal tenderness. There is no guarding or rebound.  Musculoskeletal:        General: Tenderness present. No deformity. Normal range of motion.     Cervical back: Neck supple.     Comments: She is nontender cervical and thoracic spine.  She has some midline lumbar tenderness.  There is no overlying bruising or step-offs.  Upper and lower extremities full range of motion without any pain or limitations.  Skin:    General: Skin is warm and dry.     Capillary Refill: Capillary refill takes less than 2 seconds.  Neurological:     General: No focal deficit present.     Mental Status: She is alert.     Sensory: No sensory  deficit.     Motor: No weakness.     ED Results / Procedures / Treatments   Labs (all labs ordered are listed, but only abnormal results are displayed) Labs Reviewed - No data to display  EKG None  Radiology CT Lumbar Spine Wo Contrast  Result Date: 01/20/2023 CLINICAL DATA:  89 year old female with fall 2 days ago and low back pain. EXAM: CT LUMBAR SPINE WITHOUT CONTRAST TECHNIQUE: Multidetector CT imaging of the lumbar spine was performed without intravenous contrast administration. Multiplanar CT image reconstructions were also generated. RADIATION DOSE REDUCTION: This exam was performed according to the departmental dose-optimization program which includes automated exposure control, adjustment of the mA and/or kV according to patient size and/or use of iterative reconstruction technique. COMPARISON:  Lumbar spine CT 07/15/2021. FINDINGS: Segmentation: Normal, the same numbering system use last year. Alignment: No significant change in lordosis since last year, including mild chronic retrolisthesis of L1 on L2. Vertebrae: Chronic diffuse osteopenia. Visible lower thoracic levels, visible posterior ribs appear to remain intact. However, new L1 compression fracture with 40% loss of vertebral body height. Superior endplate sclerosis and comminution with retropulsion of the posterosuperior endplate, anterior displacement of the anterior superior endplate. L1 pedicles and posterior elements appear to remain intact and aligned. 50% spinal stenosis due to retropulsion when compared to the same level last year (series 5, image 38). This fracture is also new compared to chest CT 07/18/2021. L2 through L4 appear stable and intact. Chronic L5 compression fracture with retropulsion is stable since last year. Visible sacrum and SI joints appear stable, there is a chronic left sacral ala fracture which appears healed on series 3, image 106. Paraspinal and other soft tissues: Dystrophic calcification versus  postoperative changes at the right adrenal bed, unchanged. Left adrenal calcification is also chronic. Superimposed Aortoiliac calcified atherosclerosis. Negative visible other noncontrast abdominal viscera. Atelectasis in the visible costophrenic angles. No significant L1 level paraspinal edema or hematoma. Disc levels: Stable aside from 50% spinal stenosis at L1 related to retropulsion of the posterosuperior endplate. Mostly mild for age lumbar spine degeneration. And stable retropulsion of the L5 posterosuperior endplate. IMPRESSION: 1. New L1 compression fracture with 40% loss of height and retropulsion resulting in 50% spinal stenosis at that level. This is new from last year and most likely acute in this setting. 2. Underlying diffuse osteopenia. No other acute osseous abnormality identified. Stable chronic L5 compression fracture, chronic left sacral ala fracture. 3.  Aortic Atherosclerosis (ICD10-I70.0). Electronically Signed   By: Odessa Fleming M.D.   On: 01/20/2023 08:55    Procedures Procedures  Medications Ordered in ED Medications  HYDROcodone-acetaminophen (NORCO/VICODIN) 5-325 MG per tablet 1 tablet (1 tablet Oral Given 01/20/23 1019)  ondansetron (ZOFRAN-ODT) disintegrating tablet 8 mg (8 mg Oral Given 01/20/23 1020)    ED Course/ Medical Decision Making/ A&P Clinical Course as of 01/20/23 1732  Sat Jan 20, 2023  0932 Discussed with neurosurgery PA Esperanza Richters covering Dr. Jake Samples.  She is recommending LSO brace and outpatient follow-up in the clinic 2 to 4 weeks.  I reviewed this with the patient and daughter.  They are in agreement with plan. [MB]  1302 Brace was delivered and applied to patient.  She is feeling better.  Daughter asking for a prescription for some pain medicine for discharge.  Looks like she got a little bit hypoxic with the hydrocodone so maybe we will try her on some tramadol. [MB]    Clinical Course User Index [MB] Terrilee Files, MD                                  Medical Decision Making Amount and/or Complexity of Data Reviewed Radiology: ordered.  Risk Prescription drug management.   This patient complains of low back pain after a fall; this involves an extensive number of treatment Options and is a complaint that carries with it a high risk of complications and morbidity. The differential includes contusion, fracture, retroperitoneal bleed, neuropathy, radiculopathy  I ordered medication oral pain and nausea medication and reviewed PMP when indicated. I ordered imaging studies which included CT lumbar and I independently    visualized and interpreted imaging which showed acute lumbar compression fracture Additional history obtained from patient's daughter Previous records obtained and reviewed in epic including prior ED visits I consulted neurosurgery PA [facility] covering Dr. Jake Samples and discussed lab and imaging findings and discussed disposition.  Cardiac monitoring reviewed, sinus rhythm Social determinants considered, physically inactive Critical Interventions: None  After the interventions stated above, I reevaluated the patient and found patient to be otherwise well-appearing in no distress Admission and further testing considered, she was fitted for and given a LSO brace.  She and her daughter are comfortable plan for discharge and outpatient follow-up.  Return instructions discussed         Final Clinical Impression(s) / ED Diagnoses Final diagnoses:  Closed compression fracture of body of L1 vertebra (HCC)    Rx / DC Orders ED Discharge Orders          Ordered    traMADol (ULTRAM) 50 MG tablet  Every 6 hours PRN        01/20/23 1304              Terrilee Files, MD 01/20/23 1734

## 2023-01-22 ENCOUNTER — Ambulatory Visit (HOSPITAL_COMMUNITY)
Admission: RE | Admit: 2023-01-22 | Discharge: 2023-01-22 | Disposition: A | Discharge status: Home / Self Care | Payer: Medicare Other | Source: Ambulatory Visit | Attending: Ophthalmology | Admitting: Ophthalmology

## 2023-01-22 ENCOUNTER — Ambulatory Visit (HOSPITAL_COMMUNITY): Payer: Medicare Other | Admitting: Anesthesiology

## 2023-01-22 ENCOUNTER — Encounter (HOSPITAL_COMMUNITY)
Admission: RE | Disposition: A | Discharge status: Home / Self Care | Payer: Self-pay | Source: Ambulatory Visit | Attending: Ophthalmology

## 2023-01-22 ENCOUNTER — Ambulatory Visit (HOSPITAL_BASED_OUTPATIENT_CLINIC_OR_DEPARTMENT_OTHER): Payer: Medicare Other | Admitting: Anesthesiology

## 2023-01-22 DIAGNOSIS — H2589 Other age-related cataract: Secondary | ICD-10-CM | POA: Diagnosis not present

## 2023-01-22 DIAGNOSIS — N189 Chronic kidney disease, unspecified: Secondary | ICD-10-CM | POA: Diagnosis not present

## 2023-01-22 DIAGNOSIS — H402213 Chronic angle-closure glaucoma, right eye, severe stage: Secondary | ICD-10-CM | POA: Diagnosis not present

## 2023-01-22 DIAGNOSIS — H02032 Senile entropion of right lower eyelid: Secondary | ICD-10-CM | POA: Diagnosis not present

## 2023-01-22 DIAGNOSIS — H402221 Chronic angle-closure glaucoma, left eye, mild stage: Secondary | ICD-10-CM | POA: Insufficient documentation

## 2023-01-22 DIAGNOSIS — H35372 Puckering of macula, left eye: Secondary | ICD-10-CM | POA: Diagnosis not present

## 2023-01-22 DIAGNOSIS — H5461 Unqualified visual loss, right eye, normal vision left eye: Secondary | ICD-10-CM | POA: Diagnosis not present

## 2023-01-22 DIAGNOSIS — H25812 Combined forms of age-related cataract, left eye: Secondary | ICD-10-CM

## 2023-01-22 DIAGNOSIS — H0100A Unspecified blepharitis right eye, upper and lower eyelids: Secondary | ICD-10-CM | POA: Insufficient documentation

## 2023-01-22 DIAGNOSIS — H4089 Other specified glaucoma: Secondary | ICD-10-CM | POA: Diagnosis not present

## 2023-01-22 DIAGNOSIS — H43812 Vitreous degeneration, left eye: Secondary | ICD-10-CM | POA: Diagnosis not present

## 2023-01-22 DIAGNOSIS — H0100B Unspecified blepharitis left eye, upper and lower eyelids: Secondary | ICD-10-CM | POA: Insufficient documentation

## 2023-01-22 DIAGNOSIS — H59022 Cataract (lens) fragments in eye following cataract surgery, left eye: Secondary | ICD-10-CM | POA: Diagnosis not present

## 2023-01-22 DIAGNOSIS — H43392 Other vitreous opacities, left eye: Secondary | ICD-10-CM | POA: Diagnosis not present

## 2023-01-22 DIAGNOSIS — H2702 Aphakia, left eye: Secondary | ICD-10-CM | POA: Diagnosis not present

## 2023-01-22 DIAGNOSIS — H2512 Age-related nuclear cataract, left eye: Secondary | ICD-10-CM

## 2023-01-22 HISTORY — PX: CATARACT EXTRACTION W/PHACO: SHX586

## 2023-01-22 SURGERY — PHACOEMULSIFICATION, CATARACT, WITH IOL INSERTION
Anesthesia: Monitor Anesthesia Care | Site: Eye | Laterality: Left

## 2023-01-22 MED ORDER — LIDOCAINE HCL 3.5 % OP GEL
1.0000 | Freq: Once | OPHTHALMIC | Status: AC
  Administered 2023-01-22: 1 via OPHTHALMIC

## 2023-01-22 MED ORDER — TETRACAINE HCL 0.5 % OP SOLN
1.0000 [drp] | OPHTHALMIC | Status: AC | PRN
  Administered 2023-01-22 (×3): 1 [drp] via OPHTHALMIC

## 2023-01-22 MED ORDER — SODIUM HYALURONATE 10 MG/ML IO SOLUTION
PREFILLED_SYRINGE | INTRAOCULAR | Status: DC | PRN
  Administered 2023-01-22: .85 mL via INTRAOCULAR

## 2023-01-22 MED ORDER — TROPICAMIDE 1 % OP SOLN
1.0000 [drp] | OPHTHALMIC | Status: AC | PRN
  Administered 2023-01-22 (×3): 1 [drp] via OPHTHALMIC

## 2023-01-22 MED ORDER — NEOMYCIN-POLYMYXIN-DEXAMETH 3.5-10000-0.1 OP SUSP
OPHTHALMIC | Status: DC | PRN
  Administered 2023-01-22: 2 [drp] via OPHTHALMIC

## 2023-01-22 MED ORDER — SODIUM HYALURONATE 23MG/ML IO SOSY
PREFILLED_SYRINGE | INTRAOCULAR | Status: DC | PRN
  Administered 2023-01-22 (×2): .6 mL via INTRAOCULAR

## 2023-01-22 MED ORDER — EPINEPHRINE PF 1 MG/ML IJ SOLN
INTRAOCULAR | Status: DC | PRN
  Administered 2023-01-22: 500 mL

## 2023-01-22 MED ORDER — STERILE WATER FOR IRRIGATION IR SOLN
Status: DC | PRN
  Administered 2023-01-22: 1000 mL

## 2023-01-22 MED ORDER — POVIDONE-IODINE 5 % OP SOLN
OPHTHALMIC | Status: DC | PRN
  Administered 2023-01-22: 1 via OPHTHALMIC

## 2023-01-22 MED ORDER — PHENYLEPHRINE HCL 2.5 % OP SOLN
1.0000 [drp] | OPHTHALMIC | Status: AC | PRN
  Administered 2023-01-22 (×3): 1 [drp] via OPHTHALMIC

## 2023-01-22 MED ORDER — FENTANYL CITRATE (PF) 100 MCG/2ML IJ SOLN
INTRAMUSCULAR | Status: AC
  Filled 2023-01-22: qty 2

## 2023-01-22 MED ORDER — BSS IO SOLN
INTRAOCULAR | Status: DC | PRN
  Administered 2023-01-22: 15 mL via INTRAOCULAR

## 2023-01-22 MED ORDER — LIDOCAINE HCL (PF) 1 % IJ SOLN
INTRAOCULAR | Status: DC | PRN
  Administered 2023-01-22: 1 mL via OPHTHALMIC

## 2023-01-22 MED ORDER — EPINEPHRINE PF 1 MG/ML IJ SOLN
INTRAMUSCULAR | Status: AC
  Filled 2023-01-22: qty 2

## 2023-01-22 MED ORDER — SODIUM CHLORIDE 0.9% FLUSH
INTRAVENOUS | Status: DC | PRN
  Administered 2023-01-22: 3 mL via INTRAVENOUS

## 2023-01-22 MED ORDER — FENTANYL CITRATE (PF) 100 MCG/2ML IJ SOLN
INTRAMUSCULAR | Status: DC | PRN
  Administered 2023-01-22: 25 ug via INTRAVENOUS

## 2023-01-22 SURGICAL SUPPLY — 19 items
CATARACT SUITE SIGHTPATH (MISCELLANEOUS) ×1
CLOTH BEACON ORANGE TIMEOUT ST (SAFETY) ×1 IMPLANT
EYE SHIELD UNIVERSAL CLEAR (GAUZE/BANDAGES/DRESSINGS) IMPLANT
FEE CATARACT SUITE SIGHTPATH (MISCELLANEOUS) ×1 IMPLANT
GLOVE BIOGEL PI IND STRL 7.0 (GLOVE) ×2 IMPLANT
GLOVE SURG SS PI 7.5 STRL IVOR (GLOVE) IMPLANT
GOWN STRL REUS W/TWL LRG LVL3 (GOWN DISPOSABLE) IMPLANT
NDL HYPO 18GX1.5 BLUNT FILL (NEEDLE) ×1 IMPLANT
NEEDLE HYPO 18GX1.5 BLUNT FILL (NEEDLE) ×1
PACK VITRECTOMY ANTERIOR (MISCELLANEOUS) IMPLANT
PAD ARMBOARD 7.5X6 YLW CONV (MISCELLANEOUS) ×1 IMPLANT
POSITIONER HEAD 8X9X4 ADT (SOFTGOODS) ×1 IMPLANT
PROC W NO LENS (INTRAOCULAR LENS) ×1
PROCESS W NO LENS (INTRAOCULAR LENS) IMPLANT
SIGHTPATH CAT PROC W REG LENS (Ophthalmic Related) IMPLANT
SYR TB 1ML LL NO SAFETY (SYRINGE) ×1 IMPLANT
TAPE SURG TRANSPORE 1 IN (GAUZE/BANDAGES/DRESSINGS) IMPLANT
VISCOELASTIC ADDITIONAL (OPHTHALMIC RELATED) IMPLANT
WATER STERILE IRR 250ML POUR (IV SOLUTION) ×1 IMPLANT

## 2023-01-22 NOTE — Op Note (Signed)
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 Anterior vitrectomy , left eye  Attending surgeon: Rudy Jew. Vyctoria Dickman, MD, MA  Anesthesia: MAC, Topical Akten  Complications: None  Estimated Blood Loss: <42mL (minimal)  Specimens: None  Implants: As above  Indications:  Visually significant age-related cataract, Left Eye  Procedure:  The patient was seen and identified in the pre-operative area. The operative eye was identified and dilated.  The operative eye was marked.  Topical anesthesia was administered to the operative eye.     The patient was then to the operative suite and placed in the supine position.  A timeout was performed confirming the patient, procedure to be performed, and all other relevant information.   The patient's face was prepped and draped in the usual fashion for intra-ocular surgery.  A lid speculum was placed into the operative eye and the surgical microscope moved into place and focused.  An inferotemporal paracentesis was created using a 20 gauge paracentesis blade.  Shugarcaine was injected into the anterior chamber.  Viscoelastic was injected into the anterior chamber.  A temporal clear-corneal main wound incision was created using a 2.9mm microkeratome.  A continuous curvilinear capsulorrhexis was initiated using an irrigating cystitome and completed using capsulorrhexis forceps.  Hydrodissection and hydrodeliniation were performed.  Viscoelastic was injected into the anterior chamber.  A phacoemulsification handpiece and a chopper as a second instrument were used to remove the nucleus and epinucleus.   At this time an inferior capsule tear as well as loss of  zonular support for the interior 3 clock hours was noted.  The eye was refilled with viscoelastic.  A thorough anterior vitrectomy was performed.  Due to both posterior capsule tear and zonular support loss, it was determined that no lens could safely be placed in the the eye. All cortical material was removed.  The clear corneal wound and paracentesis wounds were then hydrated and checked with Weck-Cels to be watertight. No vitreous was noted. Maxitrol drops were instilled into the operative eye. The lid-speculum was removed.  The drape was removed.  The patient's face was cleaned with a wet and dry 4x4.    A clear shield was taped over the eye. The patient was taken to the post-operative care unit in good condition, having tolerated the procedure well.  Post-Op Instructions: The patient will follow up at [facility] for a same day post-operative evaluation and will receive all other orders and instructions.

## 2023-01-22 NOTE — Anesthesia Postprocedure Evaluation (Signed)
Anesthesia Post Note  Patient: Katrina Nichols  Procedure(s) Performed: CATARACT EXTRACTION PHACO AND ANTERIOR VITRECTOMY (Left: Eye)  Patient location during evaluation: Phase II Anesthesia Type: MAC Level of consciousness: awake and alert and oriented Pain management: pain level controlled Vital Signs Assessment: post-procedure vital signs reviewed and stable Respiratory status: spontaneous breathing, nonlabored ventilation and respiratory function stable Cardiovascular status: stable and blood pressure returned to baseline Postop Assessment: no apparent nausea or vomiting Anesthetic complications: no  No notable events documented.   Last Vitals:  Vitals:   01/22/23 1120 01/22/23 1319  BP:  129/62  Pulse:  64  Resp:  (!) 8  Temp:  36.7 C  SpO2: 95% 98%    Last Pain:  Vitals:   01/22/23 1319  TempSrc: Oral  PainSc: 3                  Avangelina Flight C Jilian West

## 2023-01-22 NOTE — Discharge Instructions (Addendum)
Please discharge patient when stable, will follow up today with Dr. Wrzosek at the [facility] office immediately following discharge.  Leave shield in place until visit.  All paperwork with discharge instructions will be given at the office.  [facility] Address:  [address]  [place], [place] [address]  

## 2023-01-22 NOTE — Anesthesia Procedure Notes (Signed)
Date/Time: 01/22/2023 12:37 PM  Performed by: Franco Nones, CRNAPre-anesthesia Checklist: Patient identified, Emergency Drugs available, Suction available, Timeout performed and Patient being monitored Patient Re-evaluated:Patient Re-evaluated prior to induction Oxygen Delivery Method: Nasal Cannula

## 2023-01-22 NOTE — Interval H&P Note (Signed)
History and Physical Interval Note:  01/22/2023 11:57 AM  Katrina Nichols  has presented today for surgery, with the diagnosis of age related nuclear cataract, left eye.  The various methods of treatment have been discussed with the patient and family. After consideration of risks, benefits and other options for treatment, the patient has consented to  Procedure(s): CATARACT EXTRACTION PHACO AND INTRAOCULAR LENS PLACEMENT (IOC) (Left) as a surgical intervention.  The patient's history has been reviewed, patient examined, no change in status, stable for surgery.  I have reviewed the patient's chart and labs.  Questions were answered to the patient's satisfaction.     Fabio Pierce

## 2023-01-22 NOTE — Anesthesia Preprocedure Evaluation (Signed)
Anesthesia Evaluation  Patient identified by MRN, date of birth, ID band Patient awake    Reviewed: Allergy & Precautions, H&P , NPO status , Patient's Chart, lab work & pertinent test results  History of Anesthesia Complications Negative for: history of anesthetic complications  Airway Mallampati: II  TM Distance: >3 FB Neck ROM: Full    Dental  (+) Dental Advisory Given   Pulmonary neg pulmonary ROS   Pulmonary exam normal breath sounds clear to auscultation       Cardiovascular Exercise Tolerance: Poor METS (pelvic fx): hypertension, Pt. on medications Normal cardiovascular exam Rhythm:Regular Rate:Normal     Neuro/Psych Seizures -,   negative psych ROS   GI/Hepatic negative GI ROS, Neg liver ROS,,,  Endo/Other  negative endocrine ROS    Renal/GU Renal InsufficiencyRenal disease  negative genitourinary   Musculoskeletal negative musculoskeletal ROS (+)    Abdominal   Peds negative pediatric ROS (+)  Hematology negative hematology ROS (+)   Anesthesia Other Findings Pelvic fx, wears brace  Reproductive/Obstetrics negative OB ROS                              Anesthesia Physical Anesthesia Plan  ASA: 2  Anesthesia Plan: MAC   Post-op Pain Management: Minimal or no pain anticipated   Induction: Intravenous  PONV Risk Score and Plan: 1  Airway Management Planned: Nasal Cannula and Natural Airway  Additional Equipment:   Intra-op Plan:   Post-operative Plan:   Informed Consent: I have reviewed the patients History and Physical, chart, labs and discussed the procedure including the risks, benefits and alternatives for the proposed anesthesia with the patient or authorized representative who has indicated his/her understanding and acceptance.     Dental advisory given  Plan Discussed with: CRNA and Surgeon  Anesthesia Plan Comments:          Anesthesia Quick  Evaluation

## 2023-01-22 NOTE — Transfer of Care (Signed)
Immediate Anesthesia Transfer of Care Note  Patient: Katrina Nichols  Procedure(s) Performed: CATARACT EXTRACTION PHACO AND ANTERIOR VITRECTOMY (Left: Eye)  Patient Location: Short Stay  Anesthesia Type:MAC  Level of Consciousness: awake and patient cooperative  Airway & Oxygen Therapy: Patient Spontanous Breathing NASAL CANNULA  Post-op Assessment: Report given to RN and Post -op Vital signs reviewed and stable  Post vital signs: Reviewed and stable  Last Vitals:  Vitals Value Taken Time  BP 129/62 1327  01/22/23  Temp 98.1 1327  01/23/23  Pulse 64 1327  01/23/23  Resp 9 1327  01/23/23  SpO2 98 1327  01/23/23    Last Pain:  Vitals:   01/22/23 1319  TempSrc: Oral  PainSc: 3       Patients Stated Pain Goal: 5 (01/22/23 1103)  Complications: No notable events documented.

## 2023-01-24 ENCOUNTER — Encounter (HOSPITAL_COMMUNITY): Payer: Self-pay | Admitting: Ophthalmology

## 2023-01-24 DIAGNOSIS — H59022 Cataract (lens) fragments in eye following cataract surgery, left eye: Secondary | ICD-10-CM | POA: Diagnosis not present

## 2023-01-24 DIAGNOSIS — H33312 Horseshoe tear of retina without detachment, left eye: Secondary | ICD-10-CM | POA: Diagnosis not present

## 2023-01-24 DIAGNOSIS — H2702 Aphakia, left eye: Secondary | ICD-10-CM | POA: Diagnosis not present

## 2023-01-26 ENCOUNTER — Telehealth: Payer: Self-pay

## 2023-01-26 NOTE — Telephone Encounter (Signed)
Transition Care Management Follow-up Telephone Call Date of discharge and from where[name] How have you been since you were released from the hospital? Still in pain and hasn't followed up due to 2 different surgeries this week Any questions or concerns? No  Items Reviewed: Did the pt receive and understand the discharge instructions provided? Yes  Medications obtained and verified? No  Other? No  Any new allergies since your discharge? No  Dietary orders reviewed? No Do you have support at home? Yes     Follow up appointments reviewed:  PCP Hospital f/u appt confirmed? No  Scheduled to see  on  @ . Specialist Hospital f/u appt confirmed? No  Scheduled to see  on  @ . Are transportation arrangements needed? No  If their condition worsens, is the pt aware to call PCP or go to the Emergency Dept.? Yes Was the patient provided with contact information for the PCP's office or ED? Yes Was to pt encouraged to call back with questions or concerns? Yes

## 2023-01-26 NOTE — Telephone Encounter (Signed)
Transition Care Management Unsuccessful Follow-up Telephone Call  Date of discharge and from where:  [facility]  Attempts:  1st Attempt  Reason for unsuccessful TCM follow-up call:  No answer/busy   Lenard Forth [facility] Guide, [facility] [phone] [address] [place], [place], [place] [address] Phone: [phone] Email: [email or web address]
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
## 2023-02-06 DIAGNOSIS — Z681 Body mass index (BMI) 19 or less, adult: Secondary | ICD-10-CM | POA: Diagnosis not present

## 2023-02-06 DIAGNOSIS — I10 Essential (primary) hypertension: Secondary | ICD-10-CM | POA: Diagnosis not present

## 2023-02-06 DIAGNOSIS — R7309 Other abnormal glucose: Secondary | ICD-10-CM | POA: Diagnosis not present

## 2023-02-06 DIAGNOSIS — E782 Mixed hyperlipidemia: Secondary | ICD-10-CM | POA: Diagnosis not present

## 2023-02-06 DIAGNOSIS — Z0001 Encounter for general adult medical examination with abnormal findings: Secondary | ICD-10-CM | POA: Diagnosis not present

## 2023-03-01 DIAGNOSIS — H33312 Horseshoe tear of retina without detachment, left eye: Secondary | ICD-10-CM | POA: Diagnosis not present

## 2023-03-01 DIAGNOSIS — H2702 Aphakia, left eye: Secondary | ICD-10-CM | POA: Diagnosis not present

## 2023-03-01 DIAGNOSIS — H35362 Drusen (degenerative) of macula, left eye: Secondary | ICD-10-CM | POA: Diagnosis not present

## 2023-03-01 DIAGNOSIS — H59022 Cataract (lens) fragments in eye following cataract surgery, left eye: Secondary | ICD-10-CM | POA: Diagnosis not present

## 2023-03-01 DIAGNOSIS — Z9889 Other specified postprocedural states: Secondary | ICD-10-CM | POA: Diagnosis not present

## 2023-03-08 DIAGNOSIS — D72829 Elevated white blood cell count, unspecified: Secondary | ICD-10-CM | POA: Diagnosis not present

## 2023-03-08 DIAGNOSIS — R7309 Other abnormal glucose: Secondary | ICD-10-CM | POA: Diagnosis not present

## 2023-03-08 DIAGNOSIS — N289 Disorder of kidney and ureter, unspecified: Secondary | ICD-10-CM | POA: Diagnosis not present

## 2023-03-26 DIAGNOSIS — Z710 Person encountering health services to consult on behalf of another person: Secondary | ICD-10-CM | POA: Diagnosis not present

## 2023-03-26 DIAGNOSIS — I10 Essential (primary) hypertension: Secondary | ICD-10-CM | POA: Diagnosis not present

## 2023-03-26 DIAGNOSIS — N289 Disorder of kidney and ureter, unspecified: Secondary | ICD-10-CM | POA: Diagnosis not present

## 2023-03-26 DIAGNOSIS — D72829 Elevated white blood cell count, unspecified: Secondary | ICD-10-CM | POA: Diagnosis not present

## 2023-03-26 DIAGNOSIS — R7309 Other abnormal glucose: Secondary | ICD-10-CM | POA: Diagnosis not present

## 2023-03-28 DIAGNOSIS — R531 Weakness: Secondary | ICD-10-CM | POA: Diagnosis not present

## 2023-03-28 DIAGNOSIS — R634 Abnormal weight loss: Secondary | ICD-10-CM | POA: Diagnosis not present

## 2023-03-28 DIAGNOSIS — I10 Essential (primary) hypertension: Secondary | ICD-10-CM | POA: Diagnosis not present

## 2023-03-28 DIAGNOSIS — R569 Unspecified convulsions: Secondary | ICD-10-CM | POA: Diagnosis not present

## 2023-03-28 DIAGNOSIS — E782 Mixed hyperlipidemia: Secondary | ICD-10-CM | POA: Diagnosis not present

## 2023-03-28 DIAGNOSIS — N3946 Mixed incontinence: Secondary | ICD-10-CM | POA: Diagnosis not present

## 2023-03-28 DIAGNOSIS — N289 Disorder of kidney and ureter, unspecified: Secondary | ICD-10-CM | POA: Diagnosis not present

## 2023-03-28 DIAGNOSIS — R1310 Dysphagia, unspecified: Secondary | ICD-10-CM | POA: Diagnosis not present

## 2023-03-28 DIAGNOSIS — E46 Unspecified protein-calorie malnutrition: Secondary | ICD-10-CM | POA: Diagnosis not present

## 2023-03-29 DIAGNOSIS — E782 Mixed hyperlipidemia: Secondary | ICD-10-CM | POA: Diagnosis not present

## 2023-03-29 DIAGNOSIS — N289 Disorder of kidney and ureter, unspecified: Secondary | ICD-10-CM | POA: Diagnosis not present

## 2023-03-29 DIAGNOSIS — R1310 Dysphagia, unspecified: Secondary | ICD-10-CM | POA: Diagnosis not present

## 2023-03-29 DIAGNOSIS — I10 Essential (primary) hypertension: Secondary | ICD-10-CM | POA: Diagnosis not present

## 2023-03-29 DIAGNOSIS — E46 Unspecified protein-calorie malnutrition: Secondary | ICD-10-CM | POA: Diagnosis not present

## 2023-03-29 DIAGNOSIS — R569 Unspecified convulsions: Secondary | ICD-10-CM | POA: Diagnosis not present

## 2023-03-30 DIAGNOSIS — N289 Disorder of kidney and ureter, unspecified: Secondary | ICD-10-CM | POA: Diagnosis not present

## 2023-03-30 DIAGNOSIS — R569 Unspecified convulsions: Secondary | ICD-10-CM | POA: Diagnosis not present

## 2023-03-30 DIAGNOSIS — E782 Mixed hyperlipidemia: Secondary | ICD-10-CM | POA: Diagnosis not present

## 2023-03-30 DIAGNOSIS — R1310 Dysphagia, unspecified: Secondary | ICD-10-CM | POA: Diagnosis not present

## 2023-03-30 DIAGNOSIS — E46 Unspecified protein-calorie malnutrition: Secondary | ICD-10-CM | POA: Diagnosis not present

## 2023-03-30 DIAGNOSIS — I10 Essential (primary) hypertension: Secondary | ICD-10-CM | POA: Diagnosis not present

## 2023-03-31 DIAGNOSIS — R569 Unspecified convulsions: Secondary | ICD-10-CM | POA: Diagnosis not present

## 2023-03-31 DIAGNOSIS — N289 Disorder of kidney and ureter, unspecified: Secondary | ICD-10-CM | POA: Diagnosis not present

## 2023-03-31 DIAGNOSIS — R1310 Dysphagia, unspecified: Secondary | ICD-10-CM | POA: Diagnosis not present

## 2023-03-31 DIAGNOSIS — I10 Essential (primary) hypertension: Secondary | ICD-10-CM | POA: Diagnosis not present

## 2023-03-31 DIAGNOSIS — E46 Unspecified protein-calorie malnutrition: Secondary | ICD-10-CM | POA: Diagnosis not present

## 2023-03-31 DIAGNOSIS — E782 Mixed hyperlipidemia: Secondary | ICD-10-CM | POA: Diagnosis not present

## 2023-04-01 DIAGNOSIS — I10 Essential (primary) hypertension: Secondary | ICD-10-CM | POA: Diagnosis not present

## 2023-04-01 DIAGNOSIS — R569 Unspecified convulsions: Secondary | ICD-10-CM | POA: Diagnosis not present

## 2023-04-01 DIAGNOSIS — E46 Unspecified protein-calorie malnutrition: Secondary | ICD-10-CM | POA: Diagnosis not present

## 2023-04-01 DIAGNOSIS — E782 Mixed hyperlipidemia: Secondary | ICD-10-CM | POA: Diagnosis not present

## 2023-04-01 DIAGNOSIS — R1310 Dysphagia, unspecified: Secondary | ICD-10-CM | POA: Diagnosis not present

## 2023-04-01 DIAGNOSIS — N289 Disorder of kidney and ureter, unspecified: Secondary | ICD-10-CM | POA: Diagnosis not present

## 2023-04-02 DIAGNOSIS — R569 Unspecified convulsions: Secondary | ICD-10-CM | POA: Diagnosis not present

## 2023-04-02 DIAGNOSIS — E46 Unspecified protein-calorie malnutrition: Secondary | ICD-10-CM | POA: Diagnosis not present

## 2023-04-02 DIAGNOSIS — I10 Essential (primary) hypertension: Secondary | ICD-10-CM | POA: Diagnosis not present

## 2023-04-02 DIAGNOSIS — E782 Mixed hyperlipidemia: Secondary | ICD-10-CM | POA: Diagnosis not present

## 2023-04-02 DIAGNOSIS — R1310 Dysphagia, unspecified: Secondary | ICD-10-CM | POA: Diagnosis not present

## 2023-04-02 DIAGNOSIS — N289 Disorder of kidney and ureter, unspecified: Secondary | ICD-10-CM | POA: Diagnosis not present

## 2023-04-03 DIAGNOSIS — R569 Unspecified convulsions: Secondary | ICD-10-CM | POA: Diagnosis not present

## 2023-04-03 DIAGNOSIS — E46 Unspecified protein-calorie malnutrition: Secondary | ICD-10-CM | POA: Diagnosis not present

## 2023-04-03 DIAGNOSIS — E782 Mixed hyperlipidemia: Secondary | ICD-10-CM | POA: Diagnosis not present

## 2023-04-03 DIAGNOSIS — I10 Essential (primary) hypertension: Secondary | ICD-10-CM | POA: Diagnosis not present

## 2023-04-03 DIAGNOSIS — R1310 Dysphagia, unspecified: Secondary | ICD-10-CM | POA: Diagnosis not present

## 2023-04-03 DIAGNOSIS — N289 Disorder of kidney and ureter, unspecified: Secondary | ICD-10-CM | POA: Diagnosis not present

## 2023-04-04 DIAGNOSIS — R569 Unspecified convulsions: Secondary | ICD-10-CM | POA: Diagnosis not present

## 2023-04-04 DIAGNOSIS — E782 Mixed hyperlipidemia: Secondary | ICD-10-CM | POA: Diagnosis not present

## 2023-04-04 DIAGNOSIS — R1310 Dysphagia, unspecified: Secondary | ICD-10-CM | POA: Diagnosis not present

## 2023-04-04 DIAGNOSIS — I10 Essential (primary) hypertension: Secondary | ICD-10-CM | POA: Diagnosis not present

## 2023-04-04 DIAGNOSIS — N289 Disorder of kidney and ureter, unspecified: Secondary | ICD-10-CM | POA: Diagnosis not present

## 2023-04-04 DIAGNOSIS — E46 Unspecified protein-calorie malnutrition: Secondary | ICD-10-CM | POA: Diagnosis not present

## 2023-04-13 DEATH — deceased
